# Patient Record
Sex: Male | Born: 1935 | Race: White | Hispanic: No | Smoking: Former smoker
Health system: Southern US, Community
[De-identification: ages and names within clinical notes are randomized; demographics above are authoritative.]

## PROBLEM LIST (undated history)

## (undated) DIAGNOSIS — I10 Essential (primary) hypertension: Secondary | ICD-10-CM

## (undated) DIAGNOSIS — I723 Aneurysm of iliac artery: Secondary | ICD-10-CM

## (undated) DIAGNOSIS — I442 Atrioventricular block, complete: Secondary | ICD-10-CM

## (undated) DIAGNOSIS — I2510 Atherosclerotic heart disease of native coronary artery without angina pectoris: Secondary | ICD-10-CM

## (undated) DIAGNOSIS — C61 Malignant neoplasm of prostate: Secondary | ICD-10-CM

## (undated) DIAGNOSIS — I471 Supraventricular tachycardia: Secondary | ICD-10-CM

## (undated) DIAGNOSIS — K219 Gastro-esophageal reflux disease without esophagitis: Secondary | ICD-10-CM

## (undated) HISTORY — PX: CATARACT EXTRACTION: SUR2

## (undated) HISTORY — DX: Atrioventricular block, complete: I44.2

## (undated) HISTORY — DX: Gastro-esophageal reflux disease without esophagitis: K21.9

## (undated) HISTORY — DX: Supraventricular tachycardia: I47.1

## (undated) HISTORY — PX: KIDNEY STONE SURGERY: SHX686

## (undated) HISTORY — DX: Essential (primary) hypertension: I10

## (undated) HISTORY — PX: ROTATOR CUFF REPAIR: SHX139

## (undated) HISTORY — DX: Atherosclerotic heart disease of native coronary artery without angina pectoris: I25.10

## (undated) HISTORY — PX: INGUINAL HERNIA REPAIR: SUR1180

## (undated) HISTORY — DX: Aneurysm of iliac artery: I72.3

## (undated) HISTORY — DX: Malignant neoplasm of prostate: C61

---

## 1999-09-01 ENCOUNTER — Ambulatory Visit (HOSPITAL_COMMUNITY): Admission: RE | Admit: 1999-09-01 | Discharge: 1999-09-01 | Payer: Self-pay | Admitting: *Deleted

## 1999-09-01 ENCOUNTER — Encounter: Payer: Self-pay | Admitting: *Deleted

## 1999-12-16 ENCOUNTER — Encounter (INDEPENDENT_AMBULATORY_CARE_PROVIDER_SITE_OTHER): Payer: Self-pay | Admitting: Specialist

## 1999-12-16 ENCOUNTER — Ambulatory Visit (HOSPITAL_COMMUNITY): Admission: RE | Admit: 1999-12-16 | Discharge: 1999-12-16 | Payer: Self-pay | Admitting: Gastroenterology

## 2003-05-16 ENCOUNTER — Emergency Department (HOSPITAL_COMMUNITY): Admission: EM | Admit: 2003-05-16 | Discharge: 2003-05-16 | Payer: Self-pay | Admitting: Emergency Medicine

## 2003-05-29 ENCOUNTER — Emergency Department (HOSPITAL_COMMUNITY): Admission: EM | Admit: 2003-05-29 | Discharge: 2003-05-29 | Payer: Self-pay | Admitting: Emergency Medicine

## 2005-02-22 HISTORY — PX: CARDIAC CATHETERIZATION: SHX172

## 2005-03-02 ENCOUNTER — Encounter (INDEPENDENT_AMBULATORY_CARE_PROVIDER_SITE_OTHER): Payer: Self-pay | Admitting: *Deleted

## 2005-03-02 ENCOUNTER — Ambulatory Visit (HOSPITAL_COMMUNITY): Admission: RE | Admit: 2005-03-02 | Discharge: 2005-03-02 | Payer: Self-pay | Admitting: Gastroenterology

## 2005-12-07 ENCOUNTER — Inpatient Hospital Stay (HOSPITAL_BASED_OUTPATIENT_CLINIC_OR_DEPARTMENT_OTHER): Admission: RE | Admit: 2005-12-07 | Discharge: 2005-12-07 | Payer: Self-pay | Admitting: Interventional Cardiology

## 2005-12-09 ENCOUNTER — Observation Stay (HOSPITAL_COMMUNITY): Admission: RE | Admit: 2005-12-09 | Discharge: 2005-12-10 | Payer: Self-pay | Admitting: Interventional Cardiology

## 2006-01-27 ENCOUNTER — Encounter (HOSPITAL_COMMUNITY): Admission: RE | Admit: 2006-01-27 | Discharge: 2006-04-27 | Payer: Self-pay | Admitting: Interventional Cardiology

## 2006-03-02 ENCOUNTER — Encounter: Admission: RE | Admit: 2006-03-02 | Discharge: 2006-03-02 | Payer: Self-pay | Admitting: Interventional Cardiology

## 2006-04-28 ENCOUNTER — Encounter (HOSPITAL_COMMUNITY): Admission: RE | Admit: 2006-04-28 | Discharge: 2006-05-20 | Payer: Self-pay | Admitting: Interventional Cardiology

## 2007-03-07 ENCOUNTER — Encounter: Admission: RE | Admit: 2007-03-07 | Discharge: 2007-03-07 | Payer: Self-pay | Admitting: Interventional Cardiology

## 2008-08-01 ENCOUNTER — Encounter: Admission: RE | Admit: 2008-08-01 | Discharge: 2008-08-01 | Payer: Self-pay | Admitting: Interventional Cardiology

## 2010-01-19 ENCOUNTER — Inpatient Hospital Stay (HOSPITAL_COMMUNITY): Admission: EM | Admit: 2010-01-19 | Discharge: 2010-01-21 | Payer: Self-pay | Admitting: Emergency Medicine

## 2010-01-19 ENCOUNTER — Ambulatory Visit: Payer: Self-pay | Admitting: Internal Medicine

## 2010-01-20 ENCOUNTER — Encounter: Payer: Self-pay | Admitting: Internal Medicine

## 2010-01-20 HISTORY — PX: INSERT / REPLACE / REMOVE PACEMAKER: SUR710

## 2010-01-28 ENCOUNTER — Encounter: Payer: Self-pay | Admitting: Internal Medicine

## 2010-01-29 ENCOUNTER — Ambulatory Visit: Payer: Self-pay

## 2010-01-29 ENCOUNTER — Encounter: Payer: Self-pay | Admitting: Internal Medicine

## 2010-03-24 NOTE — Procedures (Signed)
Summary: wound check.mdt.amber   Current Medications (verified): 1)  Hydrochlorothiazide 25 Mg Tabs (Hydrochlorothiazide) .... Take 1/2 Tablet By Mouth Once Daily 2)  Omeprazole 20 Mg Cpdr (Omeprazole) .... Take 1 Capsule By Mouth Once Daily 3)  Losartan Potassium 100 Mg Tabs (Losartan Potassium) .... Take 1 Tablet By Mouth Once Daily 4)  Plavix 75 Mg Tabs (Clopidogrel Bisulfate) .... Take 1 Tablet By Mouth Once Daily 5)  Vitamin E 1000 Unit Caps (Vitamin E) .... Take 1 Capsule By Mouth Once Daily 6)  Pravastatin Sodium 20 Mg Tabs (Pravastatin Sodium) .... Take 1 Tablet By Mouth Once Daily 7)  Metoprolol Tartrate 25 Mg Tabs (Metoprolol Tartrate) .... Take 1 Tablet By Mouth Once Daily 8)  Aspirin 325 Mg Tabs (Aspirin) .... Take 1 Tablet By Mouth Once Daily 9)  Loratadine 10 Mg Tabs (Loratadine) .... Take 1 Tablet By Mouth Once Daily 10)  M2 Zinc-50 50 Mg Tabs (Zinc) .... Take 1 Tablet By Mouth Once Daily 11)  Calcium Carbonate 600 Mg Tabs (Calcium Carbonate) .... Take 1 Tablet By Mouth Once Daily 12)  B Complex  Tabs (B Complex Vitamins) .... Take 1 Tablet By Mouth Once Daily 13)  Centrum Silver  Tabs (Multiple Vitamins-Minerals) .... Take 1 Tablet By Mouth Once Daily 14)  Icaps Mv  Tabs (Multiple Vitamins-Minerals) .... Take 1 Tablet By Mouth Once Daily 15)  Vitamin C 1000 Mg Tabs (Ascorbic Acid) .... Take 1 Tablet By Mouth Once Daily  Allergies (verified): 1)  ! Pcn  PPM Specifications Following MD:  Lewayne Bunting, MD     PPM Vendor:  Medtronic     PPM Model Number:  [id number]     PPM Serial Number:  [id number] PPM DOI:  01/20/2010     PPM Implanting MD:  Lewayne Bunting, MD  Lead 1    Location: RA     DOI: 01/20/2010     Model #: [id number]     Serial #: [id number]     Status: active Lead 2    Location: RV     DOI: 01/20/2010     Model #: [id number]     Serial #: [id number]     Status: active  Magnet Response Rate:  BOL 85 ERI 65  Indications:  CHB   PPM Follow Up Battery Voltage:  2.79 V      Battery Est. Longevity:  10 yrs       PPM Device Measurements Atrium  Amplitude: 5.60 mV, Impedance: 489 ohms, Threshold: 0.625 V at 0.40 msec Right Ventricle  Amplitude: PACED mV, Impedance: 1277 ohms, Threshold: 0.50 V at 0.40 msec  Episodes MS Episodes:  1     Percent Mode Switch:  <0.1%     Ventricular High Rate:  0     Atrial Pacing:  27.7%     Ventricular Pacing:  96.8%  Parameters Mode:  DDDR     Lower Rate Limit:  60     Upper Rate Limit:  120 Paced AV Delay:  150     Sensed AV Delay:  120 Next Cardiology Appt Due:  05/05/2010 Tech Comments:  WOUND CHECK---STERI STRIPS REMOVED.  MINIMAL SWELLING AND BRUISING AT SITE.  1 MODE SWITCH LESS THAN 1 MINUTE.  NORMAL DEVICE FUNCTION.  NO CHANGES MADE. ROV 05-05-10 @ [address]/GT. Vella Kohler  January 29, 2010 4:22 PM   Patient Instructions: 1)  OFFICE VISIT WITH DR Ladona Ridgel 05-05-10 @ [phone]

## 2010-03-24 NOTE — Miscellaneous (Signed)
Summary: Device preload  Clinical Lists Changes  Observations: Added new observation of PPM INDICATN: CHB (01/28/2010 13:37) Added new observation of MAGNET RTE: BOL 85 ERI 65 (01/28/2010 13:37) Added new observation of PPMLEADSTAT2: active (01/28/2010 13:37) Added new observation of PPMLEADSER2: [id number] (01/28/2010 13:37) Added new observation of PPMLEADMOD2: 5076  (01/28/2010 13:37) Added new observation of PPMLEADDOI2: 01/20/2010  (01/28/2010 13:37) Added new observation of PPMLEADLOC2: RV  (01/28/2010 13:37) Added new observation of PPMLEADSTAT1: active  (01/28/2010 13:37) Added new observation of PPMLEADSER1: [id number]  (01/28/2010 13:37) Added new observation of PPMLEADMOD1: 5076  (01/28/2010 13:37) Added new observation of PPMLEADDOI1: 01/20/2010  (01/28/2010 13:37) Added new observation of PPMLEADLOC1: RA  (01/28/2010 13:37) Added new observation of PPM IMP MD: Lewayne Bunting, MD  (01/28/2010 13:37) Added new observation of PPM DOI: 01/20/2010  (01/28/2010 13:37) Added new observation of PPM SERL#: [id number]  (01/28/2010 13:37) Added new observation of PPM MODL#: [id number]  (01/28/2010 37:16) Added new observation of PACEMAKERMFG: Medtronic  (01/28/2010 13:37) Added new observation of PACEMAKER MD: Lewayne Bunting, MD  (01/28/2010 13:37)      PPM Specifications Following MD:  Lewayne Bunting, MD     PPM Vendor:  Medtronic     PPM Model Number:  [id number]     PPM Serial Number:  [id number] PPM DOI:  01/20/2010     PPM Implanting MD:  Lewayne Bunting, MD  Lead 1    Location: RA     DOI: 01/20/2010     Model #: [id number]     Serial #: [id number]     Status: active Lead 2    Location: RV     DOI: 01/20/2010     Model #: [id number]     Serial #: [id number]     Status: active  Magnet Response Rate:  BOL 85 ERI 65  Indications:  CHB

## 2010-03-26 NOTE — Cardiovascular Report (Signed)
Summary: Confidential Patient Implant Record Information   Confidential Patient Implant Record Information   Imported By: Roderic Ovens 02/05/2010 12:18:33  _____________________________________________________________________  External Attachment:    Type:   Image     Comment:   External Document

## 2010-03-26 NOTE — Cardiovascular Report (Signed)
Summary: Office Visit   Office Visit   Imported By: Roderic Ovens 02/05/2010 12:17:30  _____________________________________________________________________  External Attachment:    Type:   Image     Comment:   External Document

## 2010-04-27 ENCOUNTER — Other Ambulatory Visit: Payer: Self-pay | Admitting: Interventional Cardiology

## 2010-04-27 DIAGNOSIS — I723 Aneurysm of iliac artery: Secondary | ICD-10-CM

## 2010-04-29 ENCOUNTER — Ambulatory Visit
Admission: RE | Admit: 2010-04-29 | Discharge: 2010-04-29 | Disposition: A | Discharge status: Home / Self Care | Payer: Medicare Other | Source: Ambulatory Visit | Attending: Interventional Cardiology | Admitting: Interventional Cardiology

## 2010-04-29 DIAGNOSIS — I723 Aneurysm of iliac artery: Secondary | ICD-10-CM

## 2010-04-29 MED ORDER — IOHEXOL 350 MG/ML SOLN
100.0000 mL | Freq: Once | INTRAVENOUS | Status: AC | PRN
  Administered 2010-04-29: 100 mL via INTRAVENOUS

## 2010-05-05 ENCOUNTER — Encounter (INDEPENDENT_AMBULATORY_CARE_PROVIDER_SITE_OTHER): Payer: MEDICARE | Admitting: Internal Medicine

## 2010-05-05 ENCOUNTER — Encounter: Payer: Self-pay | Admitting: Internal Medicine

## 2010-05-05 DIAGNOSIS — I10 Essential (primary) hypertension: Secondary | ICD-10-CM

## 2010-05-05 DIAGNOSIS — I5032 Chronic diastolic (congestive) heart failure: Secondary | ICD-10-CM | POA: Insufficient documentation

## 2010-05-05 DIAGNOSIS — I495 Sick sinus syndrome: Secondary | ICD-10-CM

## 2010-05-05 DIAGNOSIS — I442 Atrioventricular block, complete: Secondary | ICD-10-CM | POA: Insufficient documentation

## 2010-05-05 DIAGNOSIS — Z950 Presence of cardiac pacemaker: Secondary | ICD-10-CM | POA: Insufficient documentation

## 2010-05-05 LAB — DIFFERENTIAL
Basophils Absolute: 0 10*3/uL (ref 0.0–0.1)
Eosinophils Relative: 3 % (ref 0–5)
Lymphocytes Relative: 37 % (ref 12–46)
Lymphs Abs: 2.3 10*3/uL (ref 0.7–4.0)
Monocytes Absolute: 0.6 10*3/uL (ref 0.1–1.0)
Neutro Abs: 3 10*3/uL (ref 1.7–7.7)

## 2010-05-05 LAB — CBC
MCV: 93.9 fL (ref 78.0–100.0)
Platelets: 206 10*3/uL (ref 150–400)
RBC: 4.12 MIL/uL — ABNORMAL LOW (ref 4.22–5.81)
RDW: 13.4 % (ref 11.5–15.5)
WBC: 6.1 10*3/uL (ref 4.0–10.5)

## 2010-05-05 LAB — COMPREHENSIVE METABOLIC PANEL
AST: 30 U/L (ref 0–37)
Albumin: 3.5 g/dL (ref 3.5–5.2)
BUN: 24 mg/dL — ABNORMAL HIGH (ref 6–23)
Calcium: 8.7 mg/dL (ref 8.4–10.5)
Chloride: 106 mEq/L (ref 96–112)
Creatinine, Ser: 1.16 mg/dL (ref 0.4–1.5)
GFR calc Af Amer: >60 mL/min (ref >60)
Total Bilirubin: 0.7 mg/dL (ref 0.3–1.2)
Total Protein: 6.6 g/dL (ref 6.0–8.3)

## 2010-05-05 LAB — CARDIAC PANEL(CRET KIN+CKTOT+MB+TROPI)
CK, MB: 1.8 ng/mL (ref 0.3–4.0)
Relative Index: INVALID (ref 0.0–2.5)
Total CK: 75 U/L (ref 7–232)
Troponin I: 0.03 ng/mL (ref 0.00–0.06)

## 2010-05-05 LAB — BRAIN NATRIURETIC PEPTIDE: Pro B Natriuretic peptide (BNP): 36 pg/mL (ref 0.0–100.0)

## 2010-05-05 LAB — CK TOTAL AND CKMB (NOT AT ARMC)
CK, MB: 3.2 ng/mL (ref 0.3–4.0)
Relative Index: 2.9 — ABNORMAL HIGH (ref 0.0–2.5)
Total CK: 112 U/L (ref 7–232)

## 2010-05-05 LAB — MRSA PCR SCREENING: MRSA by PCR: NEGATIVE

## 2010-05-05 LAB — PROTIME-INR: INR: 1.3 (ref 0.00–1.49)

## 2010-05-05 LAB — TSH: TSH: 2.164 u[IU]/mL (ref 0.350–4.500)

## 2010-05-12 ENCOUNTER — Encounter (INDEPENDENT_AMBULATORY_CARE_PROVIDER_SITE_OTHER): Payer: MEDICARE | Admitting: Vascular Surgery

## 2010-05-12 DIAGNOSIS — I723 Aneurysm of iliac artery: Secondary | ICD-10-CM

## 2010-05-12 NOTE — Assessment & Plan Note (Signed)
Summary: pacer check.mdt.amber/cy   Visit Type:  Follow-up   History of Present Illness: [name] returns today for PPM followup. He is a pleasant 77 yo man with CHB, CHF, and HTN. He is s/p PPM. He has had some dyspnea and has had his medications adjusted by Dr. Eldridge Dace. He denies c/p or peripheral edema. No other complaints today.  Medications Prior to Update: 1)  Hydrochlorothiazide 25 Mg Tabs (Hydrochlorothiazide) .... Take 1/2 Tablet By Mouth Once Daily 2)  Omeprazole 20 Mg Cpdr (Omeprazole) .... Take 1 Capsule By Mouth Once Daily 3)  Losartan Potassium 100 Mg Tabs (Losartan Potassium) .... Take 1 Tablet By Mouth Once Daily 4)  Plavix 75 Mg Tabs (Clopidogrel Bisulfate) .... Take 1 Tablet By Mouth Once Daily 5)  Vitamin E 1000 Unit Caps (Vitamin E) .... Take 1 Capsule By Mouth Once Daily 6)  Pravastatin Sodium 20 Mg Tabs (Pravastatin Sodium) .... Take 1 Tablet By Mouth Once Daily 7)  Metoprolol Tartrate 25 Mg Tabs (Metoprolol Tartrate) .... Take 1 Tablet By Mouth Once Daily 8)  Aspirin 325 Mg Tabs (Aspirin) .... Take 1 Tablet By Mouth Once Daily 9)  Loratadine 10 Mg Tabs (Loratadine) .... Take 1 Tablet By Mouth Once Daily 10)  M2 Zinc-50 50 Mg Tabs (Zinc) .... Take 1 Tablet By Mouth Once Daily 11)  Calcium Carbonate 600 Mg Tabs (Calcium Carbonate) .... Take 1 Tablet By Mouth Once Daily 12)  B Complex  Tabs (B Complex Vitamins) .... Take 1 Tablet By Mouth Once Daily 13)  Centrum Silver  Tabs (Multiple Vitamins-Minerals) .... Take 1 Tablet By Mouth Once Daily 14)  Icaps Mv  Tabs (Multiple Vitamins-Minerals) .... Take 1 Tablet By Mouth Once Daily 15)  Vitamin C 1000 Mg Tabs (Ascorbic Acid) .... Take 1 Tablet By Mouth Once Daily  Allergies (verified): 1)  ! Pcn  Past History:  Past Medical History: Last updated: 05/04/2010   1. Coronary artery disease.   2. Right iliac aneurysm.   3. Hypertension.   4. GERD.   5. Prostate cancer.   6. History of SVT.   7. Hearing loss.  8.  successful implantation of a Medtronic dual-   chamber pacemaker in a patient with symptomatic complete heart block.                Past Surgical History: Last updated: 05/04/2010 Dual-chamber permanent pacemaker implantation by   Dr. Lewayne Bunting on January 20, 2010.     Right rotator cuff surgery, right inguinal hernia   surgery, left-sided kidney stone, right eye cataract.   Review of Systems  The patient denies chest pain, syncope, dyspnea on exertion, and peripheral edema.    Vital Signs:  Patient profile:   77 year old male Height:      70 inches Weight:      150 pounds BMI:     21.60 Pulse rate:   58 / minute  Vitals Entered By: Laurance Flatten CMA (May 05, 2010 10:24 AM)  Physical Exam  General:  elderly, well developed, well nourished man, in no acute distress.  HEENT: normal Neck: supple. No JVD. Carotids 2+ bilaterally no bruits Cor: RRR no rubs, gallops or murmur Lungs: CTA with well healed PPM incision. Ab: soft, nontender. nondistended. No HSM. Good bowel sounds Ext: warm. no cyanosis, clubbing or edema Neuro: alert and oriented. Grossly nonfocal. affect pleasant    PPM Specifications Following MD:  Lewayne Bunting, MD     PPM Vendor:  Medtronic  PPM Model Number:  [id number]     PPM Serial Number:  [id number] PPM DOI:  01/20/2010     PPM Implanting MD:  Lewayne Bunting, MD  Lead 1    Location: RA     DOI: 01/20/2010     Model #: [id number]     Serial #: [id number]     Status: active Lead 2    Location: RV     DOI: 01/20/2010     Model #: [id number]     Serial #: [id number]     Status: active  Magnet Response Rate:  BOL 85 ERI 65  Indications:  CHB   Parameters Mode:  DDDR     Lower Rate Limit:  60     Upper Rate Limit:  120 Paced AV Delay:  150     Sensed AV Delay:  120 MD Comments:  Normal device function.  Impression & Recommendations:  Problem # 1:  CARDIAC PACEMAKER IN SITU (ICD-V45.01) His device is working normally. Will recheck in several  months.  Problem # 2:  ESSENTIAL HYPERTENSION, BENIGN (ICD-401.1) He will maintain a low sodium diet and continue meds as below. His updated medication list for this problem includes:    Hydrochlorothiazide 25 Mg Tabs (Hydrochlorothiazide) .Marland Kitchen... Take 1/2 tablet by mouth once daily    Losartan Potassium 100 Mg Tabs (Losartan potassium) .Marland Kitchen... Take 1 tablet by mouth once daily    Metoprolol Tartrate 25 Mg Tabs (Metoprolol tartrate) .Marland Kitchen... Take 1 tablet by mouth once daily    Aspirin 81 Mg Tbec (Aspirin) .Marland Kitchen... Take one tablet by mouth daily  Problem # 3:  CHRONIC DIASTOLIC HEART FAILURE (ICD-428.32) His symptoms are class 2. He will continue his current meds and maintain a low sodium diet. His updated medication list for this problem includes:    Hydrochlorothiazide 25 Mg Tabs (Hydrochlorothiazide) .Marland Kitchen... Take 1/2 tablet by mouth once daily    Losartan Potassium 100 Mg Tabs (Losartan potassium) .Marland Kitchen... Take 1 tablet by mouth once daily    Plavix 75 Mg Tabs (Clopidogrel bisulfate) .Marland Kitchen... Take 1 tablet by mouth once daily    Metoprolol Tartrate 25 Mg Tabs (Metoprolol tartrate) .Marland Kitchen... Take 1 tablet by mouth once daily    Aspirin 81 Mg Tbec (Aspirin) .Marland Kitchen... Take one tablet by mouth daily  Patient Instructions: 1)  Your physician wants you to follow-up in:  9 months with Dr Court Joy will receive a reminder letter in the mail two months in advance. If you don't receive a letter, please call our office to schedule the follow-up appointment. 2)  Your physician recommends that you continue on your current medications as directed. Please refer to the Current Medication list given to you today.

## 2010-05-12 NOTE — Cardiovascular Report (Signed)
Summary: Office Visit   Office Visit   Imported By: Roderic Ovens 05/06/2010 11:52:36  _____________________________________________________________________  External Attachment:    Type:   Image     Comment:   External Document

## 2010-05-13 NOTE — Consult Note (Signed)
NEW PATIENT CONSULTATION  [name], [name] DOB:  07/06/1920                                       05/12/2010 WJXBJ#:[id number]  Patient is an 77 year old healthy male referred by Dr. Eldridge [name] for right common iliac artery aneurysm, which has been followed for several years.  He had a CT angiogram performed on 04/29/2010 which revealed the right common iliac aneurysm to be 2.2 cm in maximum diameter, slightly enlarged from the previously measured at 2.0 cm.  He has no evidence of abdominal aortic aneurysm or left common iliac artery aneurysm.  He is also asymptomatic.  CHRONIC MEDICAL PROBLEMS: 1. Coronary artery disease, previous PTCA and stenting. 2. Hypertension. 3. Mitral valve disorder. 4. GERD. 5. History of prostate cancer. 6. History of colonic polyps. 7. Complete heart block with pacemaker. 8. Negative for diabetes or stroke.  SOCIAL HISTORY:  He is married, has 1 child, is retired.  Does not use tobacco or alcohol.  FAMILY HISTORY:  Positive for a small abdominal aortic aneurysm in his father.  A cerebral hemorrhage in his father.  Negative for coronary artery disease or diabetes.  REVIEW OF SYSTEMS:  Positive for leg discomfort periodically.  He also has a chest tightness, dyspnea on exertion.  No orthopnea.  No chronic bronchitis or wheezing.  Does have occasional reflux esophagitis.  All other systems are negative in a complete review of systems.  PHYSICAL EXAMINATION:  Blood pressure 142/76, heart rate 77, respirations 19.  General:  He is a well-developed and well-nourished male in no apparent distress, alert and oriented x3.  HEENT:  Normal for age.  EOMs intact.  Lungs:  Clear to auscultation.  No rhonchi or wheezing.  Cardiovascular:  Regular rhythm.  His pacemaker is palpable beneath the skin in the left infraclavicular area.  Carotid pulses are 3+.  No audible bruits.  Abdomen is soft, nontender, no masses are noted.  There is no  tenderness.  Musculoskeletal exam is free of major deformities.  Neurologic:  Normal.  Skin is free of rashes.  Lower extremity exam reveals 3+ femoral and popliteal, and 2+ posterior tibial pulses palpable bilaterally.  Today I reviewed the records provided by Dr. Eldridge [name] regarding patient's cardiac status.  I also reviewed his CT angiogram which was performed on March 7.  Discussed these in detail with patient.  He has a small right common iliac artery aneurysm which does not require treatment, and it is unlikely that it will require treatment.  We will follow this with a repeat CT scan in 2 years to see if there has been any significant change.  At his age of 61 and very slow growth of this, it is unlikely that he will ever require treatment for this small, asymptomatic aneurysm.    [name] [name] [name] [name], M.D. Electronically Signed  [name] [name]  D:  05/12/2010  T:  05/13/2010  Job:  [id number]  cc:   Dr. Precious [name] [name] Crafts, MD

## 2010-07-10 NOTE — Cardiovascular Report (Signed)
NAME:  [name] [name], [name] [name]                ACCOUNT NO.:  [id number]   MEDICAL RECORD NO.:  [id number]          PATIENT TYPE:  INP   LOCATION:  6531                         FACILITY:  [facility]   PHYSICIAN:  Corky Crafts, MDDATE OF BIRTH:  [birth year]   DATE OF PROCEDURE:  12/09/2005  DATE OF DISCHARGE:                              CARDIAC CATHETERIZATION   REFERRING PHYSICIAN:  Dr. Kirby Funk   PROCEDURES PERFORMED:  PCI of the LAD, selective renal angiogram.   OPERATOR:  Dr. Eldridge Dace   INDICATIONS:  Congestive heart failure and hypertension.   PROCEDURAL NARRATIVE:  The diagnostic procedure was done 2 days before  showing a significant LAD lesion.  The risks and benefits of PCI were  explained to the patient and informed consent was obtained.  A CLS 4.0  guiding catheter was advanced in the ostium of the left main under  fluoroscopic guidance.  Intracoronary nitroglycerin was given which did  increase the size of the artery and make the stenosis more severe.  A  Prowater wire was placed down the LAD.  A 2.0 x 9-mm Maverick balloon was  inflated to 8 atmospheres for 18 seconds.  A 2.5 x 12-mm Taxus stent was  then deployed across the lesion at 12 atmospheres for 35 seconds.  There was  a small segment distal to the stent which had a residual stenosis.  It  appeared about 2 mm.  A 2.5 x 9-mm Maverick balloon was then inflated at the  distal edge of the stent and inflated to 6 atmospheres for 37 seconds.  There is an excellent angiographic result.  Flow remained TIMI 3.  We  decided not to stent this very short segment to avoid a long area of  overlap.  The selective renal angiogram was then performed.  There is a  single right renal artery which is widely patent.  There is a single left  renal artery with a 25% ostial lesion.  There is no pressure gradient.   IMPRESSIONS:  1. Successful percutaneous coronary intervention of the left anterior      descending artery with a 2.5 x 12-mm  Taxus stent.  The stent was placed      in the mid LAD.  2. No hemodynamically significant renal artery stenosis.   RECOMMENDATIONS:  The patient will be observed overnight.  We will continue  aspirin 325 mg daily and Plavix 75 mg daily indefinitely.  Will also  continue Cozaar and Toprol given his systolic dysfunction.      Corky Crafts, MD  Electronically Signed     JSV/MEDQ  D:  12/09/2005  T:  12/10/2005  Job:  [id number]

## 2010-07-10 NOTE — Procedures (Signed)
[facility]  Patient:    [name] [name], [name] [name]                         MRN: [id number] Proc. Date: 12/16/99 Attending:  Verlin Grills, M.D. CC:         Thora Lance, M.D., [facility]   Procedure Report  PROCEDURE:  Colonoscopy and rectal polypectomy.  PROCEDURE INDICATION:  [name] is a 77 year old male who is due for colorectal neoplasia screening.  He viewed our colonoscopy education film.  I discussed with him the complications associated with colonoscopy and polypectomy, including intestinal bleeding and intestinal perforation.  [name] [name] has signed the operative permit.  MEDICATION ALLERGIES:  PENICILLIN.  PAST MEDICAL HISTORY:  Prostate cancer treated with radiation therapy. Radiation proctitis.  Gastroesophageal reflux disease.  Right rotator cuff surgery.  Right inguinal herniorrhaphy.  Kidney stones requiring lithotripsy. Right cataract surgery.  CHRONIC MEDICATIONS:  Prilosec and multivitamin.  ENDOSCOPIST:  Verlin Grills, M.D.  PREMEDICATION:  Versed 5 mg, Demerol 35 mg.  ENDOSCOPE:  Olympus pediatric colonoscope.  DESCRIPTION OF PROCEDURE:  After obtaining informed consent, the patient was placed in the left lateral decubitus position.  I administered intravenous Demerol and intravenous Versed to achieve conscious sedation for the procedure.  The patients blood pressure, oxygen saturation, and cardiac rhythm were monitored throughout the procedure and documented in the medical record.  Anal inspection was normal.  Digital rectal exam revealed an absent prostate. The Olympus pediatric video colonoscope was introduced into the rectum and under direct vision advanced to the cecum, identified by a normal-appearing ileocecal valve.  Colonic preparation for the exam today was excellent.  Rectum:  From the mid-rectum, three 1 mm sessile polyps were removed with cold biopsy forceps and submitted  for pathologic interpretation.  Sigmoid colon and descending colon:  A few small diverticula of the left colon were noted.  Splenic flexure normal.  Transverse colon normal.  Hepatic flexure normal.  Ascending colon normal.  Cecum and ileocecal valve normal.  ASSESSMENT: 1. A few small diverticula are noted in the left colon. 2. Three 1 mm sessile polyps were removed with the cold biopsy forceps from    the mid-rectum.  RECOMMENDATIONS:  If rectal polyps return neoplastic, [name] [name] should undergo a repeat colonoscopy in approximately five years.  If the rectal polyps are non-neoplastic, he should undergo a repeat colonoscopy in five to 10 years. DD:  12/16/99 TD:  12/16/99 Job: [id number]

## 2010-07-10 NOTE — Discharge Summary (Signed)
NAME:  [name] [name], [name] [name]                ACCOUNT NO.:  [id number]   MEDICAL RECORD NO.:  [id number]          PATIENT TYPE:  OBV   LOCATION:  6531                         FACILITY:  [facility]   PHYSICIAN:  Corky Crafts, MDDATE OF BIRTH:  20-Nov-1920   DATE OF ADMISSION:  12/09/2005  DATE OF DISCHARGE:  12/10/2005                               DISCHARGE SUMMARY   DISCHARGE DIAGNOSES:  1. Coronary artery disease status post drug-eluting stent to the left      anterior descending artery.  2. Mitral regurgitation.  3. Left ventricular dysfunction.  4. History of palpitation.  5. Long-term medication use.   HOSPITAL COURSE:  Mr. Holcomb has been aggressively treated for his LV  dysfunction with lisinopril.  In addition, he has been aggressively  treated with beta-blockers for palpitations.  Although the beta-blockers  have helped, he has continued to have palpitations and, in addition, he  has having smothering sensations.  He was ultimately scheduled for  cardiac catheterization and was found to have a significant LAD  stenosis.  Arrangements were made for the patient to come to the  hospital for percutaneous intervention on December 09, 2005.   On that date, the patient underwent percutaneous intervention utilizing  a drug-eluting stent, on December 09, 2005.  He tolerated the procedure  well.  No renal artery stenosis noted on angiogram.  The patient was  kept in the hospital overnight and lab studies on discharge showed a  potassium of 4.3, BUN 12, creatinine 0.58, hemoglobin 13, hematocrit  38.1, and platelets 193,000.  The patient was discharged to home in  stable and improved condition.   DISCHARGE INSTRUCTIONS:  Remain on a low-fat.  No driving for 2 days.  No lifting over 10 pounds for 1 week.  Clean cath site with soap and  water.  Follow up with Dr. Eldridge Dace on December 24, 2005 at 11:30 a.m.   DISCHARGE MEDICATIONS:  1. Plavix 75 mg a day.  2. Coated aspirin 325 mg a day.  3.  Pravastatin 20 mg daily.  4. Toprol XL 25 mg a day.  5. Cozaar 100 mg a day.  6. He can continue all vitamins.  We have stopped the lisinopril and      utilized Cozaar in its place.     Guy Franco, P.A.      Corky Crafts, MD  Electronically Signed   LB/MEDQ  D:  01/12/2006  T:  01/12/2006  Job:  [id number]

## 2010-07-10 NOTE — Cardiovascular Report (Signed)
NAME:  Madonia, Sh[place]ale                ACCOUNT NO.:  [id number]   MEDICAL RECORD NO.:  [id number]          PATIENT TYPE:  OIB   LOCATION:  1963                         FACILITY:  [facility]   PHYSICIAN:  Corky Crafts, MDDATE OF BIRTH:  04/03/1920   DATE OF PROCEDURE:  12/07/2005  DATE OF DISCHARGE:                              CARDIAC CATHETERIZATION   PROCEDURES PERFORMED:  1. Left heart catheterization.  2. Left ventriculogram.  3. Coronary angiogram.  4. Abdominal aortogram.   OPERATOR:  Corky Crafts, MD   INDICATIONS:  Decreased systolic left ventricular function.   PROCEDURE NARRATIVE:  The risks and benefits of cardiac catheterization were  explained to the patient and informed consent was obtained.  The patient was  brought to the cath lab and placed on the table.  He was prepped and draped  in the usual sterile fashion.  Then 1% lidocaine was infiltrated into his  right groin.  A 4-French arterial sheath was placed into his right femoral  artery using the modified Seldinger technique.  A left coronary artery  angiography was performed using a JL-4.0 catheter.  This catheter was not  long enough to reach the ostium on the left main.  The catheter was then  switched out for a JL-5.0 catheter.  Digital angiography was performed in  multiple projections using hand injection of contrast after the catheter was  placed in the vessel ostium under fluoroscopic guidance.   A right coronary artery angiography was performed using a no torque right  coronary catheter.  The catheter was advanced to the vessel ostium under  fluoroscopic guidance.  Digital angiography was performed in multiple  projections using hand injection with contrast.  The left ventriculogram was  then performed.  A pigtail catheter was advanced to the aortic valve and  across the valve under fluoroscopic guidance.  A power injection of contrast  was done in the 30-degree RAO position.  The catheter was  pulled back under  continuous hemodynamic pressure monitoring.  The catheter was then withdrawn  level of the abdominal aorta.  A power injection of contrast was done in the  AP projection.  The sheath was removed using manual compression.   FINDINGS:  1. The left main and mild irregularities, but was widely patent.  2. The circumflex was a large vessel.  There is an ostium 25% lesion.  3. There was a small OM-1.  The OM-2 with was large and had minor      irregularities.  4. The majority of the circ had only minor irregularities.  5. The left anterior descending was a large vessel.  There is a 70-80% mid      vessel lesion after the first diagonal.  The remainder of the LAD      appeared small.  The first diagonal had an ostium 40-50% lesion; and      the second diagonal was small; the third diagonal was medium-sized.  It      was a long vessel with minor irregularities.  6. The right coronary artery is a large dominant vessel with minor  irregularities.   LEFT VENTRICULOGRAM:  The left ventriculogram reveals severe left  ventricular dysfunction with an estimated ejection fraction of 30-35%.  The  abdominal aortogram revealed single arterial supply to both kidneys.  The  right renal artery is widely patent.  The ostium in the left renal artery  had a 25% lesion.  Of note, there was also a right common iliac artery  aneurysm.   HEMODYNAMICS:  Left ventricular pressure 135/8, LVEDP of 16 mmHg.  Aortic  pressure 138/62 with a mean aortic pressure of 96 mmHg.   IMPRESSION:  1. Severe left ventricular systolic function with an estimated ejection      fraction of 30-35%.  2. A 70-80% mid-LAD lesion with other mild-to-moderate coronary artery      disease.  3. Mildly increased left ventricular end-diastolic pressure   RECOMMENDATIONS:  The patient will continue on his aggressive medical  therapy including an ARB, beta blocker; and we will add Plavix, as well, and  consider PCI of  the LAD.      Corky Crafts, MD  Electronically Signed     JSV/MEDQ  D:  12/07/2005  T:  12/08/2005  Job:  [id number]

## 2010-07-10 NOTE — Op Note (Signed)
NAME:  [name] [name], [name] [name]                ACCOUNT NO.:  [id number]   MEDICAL RECORD NO.:  [id number]          PATIENT TYPE:  AMB   LOCATION:  ENDO                         FACILITY:  [facility]   PHYSICIAN:  Danise Edge, M.D.   DATE OF BIRTH:  [birth year]   DATE OF PROCEDURE:  03/02/2005  DATE OF DISCHARGE:                                 OPERATIVE REPORT   PROCEDURE:  Colonoscopy and polypectomy.   PROCEDURE INDICATIONS:  [name] is an 77 year old male born  Jul 24, 1920. Five  years ago diminutive hyperplastic and adenomatous  polyps were removed from his rectum. [name] is scheduled to undergo a  surveillance colonoscopy with a polypectomy to prevent colon cancer.   ENDOSCOPIST:  Danise Edge, M.D.   PREMEDICATION:  Versed 2.5 mg, Demerol 50 mg.   DESCRIPTION OF PROCEDURE:  After obtaining informed consent. Mr. Schauer was  placed in the left lateral decubitus position. I administered intravenous  Demerol and intravenous Versed to achieve conscious sedation for the  procedure. The patient's blood pressure, oxygen saturation and cardiac  rhythm were monitored throughout the procedure and documented in the medical  record.   Anal inspection was normal. Digital rectal exam reveals an absent prostate.  The patient has received radiation for prostate cancer. The Olympus  adjustable pediatric colonoscope was introduced into the rectum and advanced  to the cecum. A normal-appearing ileocecal valve and appendiceal orifice  were identified. Colonic preparation for the exam today was satisfactory.   RECTUM:  Normal. Retroflex view of the distal rectum normal.  SIGMOID COLON AND DESCENDING COLON:  From the distal sigmoid colon at 20 cm  from the anal verge, a 3 mm sized polyp was removed with the cautery snare.  Extensive sigmoid colonic diverticulosis present.  SPLENIC FLEXURE:  Normal.  TRANSVERSE COLON:  Normal.  HEPATIC FLEXURE:  Normal.  ASCENDING COLON:  Normal.  CECUM  AND ILEOCECAL VALVE:  Normal.   ASSESSMENT:  A small polyp was removed from the distal sigmoid colon with  the electrocautery snare. Sigmoid colonic diverticulosis present.           ______________________________  Danise Edge, M.D.     MJ/MEDQ  D:  03/02/2005  T:  03/02/2005  Job:  [id number]   cc:   Thora Lance, M.D.  Fax: [phone]

## 2010-08-25 ENCOUNTER — Encounter: Payer: Self-pay | Admitting: Internal Medicine

## 2011-01-29 ENCOUNTER — Ambulatory Visit (INDEPENDENT_AMBULATORY_CARE_PROVIDER_SITE_OTHER): Payer: Self-pay | Admitting: Internal Medicine

## 2011-01-29 DIAGNOSIS — Z950 Presence of cardiac pacemaker: Secondary | ICD-10-CM

## 2011-01-29 DIAGNOSIS — I509 Heart failure, unspecified: Secondary | ICD-10-CM

## 2011-01-29 DIAGNOSIS — I5032 Chronic diastolic (congestive) heart failure: Secondary | ICD-10-CM

## 2011-01-29 DIAGNOSIS — I10 Essential (primary) hypertension: Secondary | ICD-10-CM

## 2011-01-31 ENCOUNTER — Encounter: Payer: Self-pay | Admitting: Internal Medicine

## 2011-01-31 NOTE — Assessment & Plan Note (Signed)
His device is working satisfactorily. Will plan to recheck in several months.

## 2011-01-31 NOTE — Progress Notes (Signed)
HPI [name] [name] returns today for followup. He is a pleasant 77 yo man with a h/o symptomatic bradycardia, s/p PPM, HTN, CAD, and dyslipidemia. He denies syncope since PPM implant. No chest pain or worsening dyspnea. Allergies  Allergen Reactions  . Penicillins      Current Outpatient Prescriptions  Medication Sig Dispense Refill  . Ascorbic Acid (VITAMIN C) 1000 MG tablet Take 1,000 mg by mouth daily.        Marland Kitchen aspirin 81 MG EC tablet Take 81 mg by mouth daily.        Marland Kitchen b complex vitamins tablet Take 1 tablet by mouth daily.        . calcium carbonate (OS-CAL) 600 MG TABS Take 600 mg by mouth daily.        . clopidogrel (PLAVIX) 75 MG tablet Take 75 mg by mouth daily.        . hydrochlorothiazide 25 MG tablet Take 12.5 mg by mouth daily.        Marland Kitchen losartan (COZAAR) 100 MG tablet Take 100 mg by mouth daily.        . M2 Zinc-50 50 MG TABS Take 1 tablet by mouth daily.        . metoprolol tartrate (LOPRESSOR) 25 MG tablet Take 25 mg by mouth daily.        . Multiple Vitamins-Minerals (CENTRUM SILVER) tablet Take 1 tablet by mouth daily.        . Multiple Vitamins-Minerals (ICAPS MV) TABS Take 1 tablet by mouth daily.        Marland Kitchen omeprazole (PRILOSEC) 20 MG capsule Take 20 mg by mouth daily.        . pravastatin (PRAVACHOL) 20 MG tablet Take 20 mg by mouth daily.        . vitamin E 1000 UNIT capsule Take 1,000 Units by mouth daily.           Past Medical History  Diagnosis Date  . Coronary artery disease   . Iliac artery aneurysm, right   . Hypertension   . GERD (gastroesophageal reflux disease)   . Prostate cancer   . SVT (supraventricular tachycardia)   . Hearing loss   . Complete heart block     Symptomatic    ROS:   All systems reviewed and negative except as noted in the HPI.   Past Surgical History  Procedure Date  . Insert / replace / remove pacemaker 01/20/10    dual-chamber permanent pacemaker implantation by Dr. Lewayne Bunting  . Rotator cuff repair     Right  .  Inguinal hernia repair     Right  . Kidney stone surgery     Left-sided  . Cataract extraction     Right eye     No family history on file.   History   Social History  . Marital Status: Married    Spouse Name: N/A    Number of Children: N/A  . Years of Education: N/A   Occupational History  . Retired    Social History Main Topics  . Smoking status: Former Games developer  . Smokeless tobacco: Not on file   Comment: Quit smoking many years ago.  . Alcohol Use: No  . Drug Use:   . Sexually Active:    Other Topics Concern  . Not on file   Social History Narrative  . No narrative on file     There were no vitals taken for this visit.  Physical Exam: BP - 134/76,  P - 60, R - 16 by me Well appearing elderly man, NAD HEENT: Unremarkable Neck:  No JVD, no thyromegally Lungs:  Clear with no wheezes. HEART:  Regular rate rhythm, no murmurs, no rubs, no clicks Abd:  soft, positive bowel sounds, no organomegally, no rebound, no guarding Ext:  2 plus pulses, no edema, no cyanosis, no clubbing Skin:  No rashes no nodules Neuro:  CN II through XII intact, motor grossly intact  DEVICE  Normal device function.  See PaceArt for details.   Assess/Plan:

## 2011-01-31 NOTE — Assessment & Plan Note (Signed)
His symptoms remain class 2. He will continue a low sodium diet, and his current meds.

## 2011-01-31 NOTE — Assessment & Plan Note (Signed)
His blood pressure is well controlled. He will continue his current meds and maintain a low sodium diet. 

## 2011-03-19 ENCOUNTER — Encounter: Payer: Self-pay | Admitting: Internal Medicine

## 2011-03-19 ENCOUNTER — Ambulatory Visit (INDEPENDENT_AMBULATORY_CARE_PROVIDER_SITE_OTHER): Payer: Medicare Other | Admitting: Internal Medicine

## 2011-03-19 DIAGNOSIS — I509 Heart failure, unspecified: Secondary | ICD-10-CM

## 2011-03-19 DIAGNOSIS — I442 Atrioventricular block, complete: Secondary | ICD-10-CM

## 2011-03-19 DIAGNOSIS — Z950 Presence of cardiac pacemaker: Secondary | ICD-10-CM

## 2011-03-19 DIAGNOSIS — I5032 Chronic diastolic (congestive) heart failure: Secondary | ICD-10-CM

## 2011-03-19 DIAGNOSIS — I10 Essential (primary) hypertension: Secondary | ICD-10-CM

## 2011-03-19 LAB — PACEMAKER DEVICE OBSERVATION
AL THRESHOLD: 0.625 V
BAMS-0001: 150 {beats}/min
RV LEAD THRESHOLD: 0.5 V

## 2011-03-19 NOTE — Assessment & Plan Note (Signed)
His chf is class 1. He will continue his current meds and maintain a low sodium diet.

## 2011-03-19 NOTE — Assessment & Plan Note (Signed)
His device is working normally. Will recheck in several months. 

## 2011-03-19 NOTE — Patient Instructions (Signed)
Your physician wants you to follow-up in: 6 months in the device clinic and 12 months with Dr Taylor You will receive a reminder letter in the mail two months in advance. If you don't receive a letter, please call our office to schedule the follow-up appointment.  

## 2011-03-19 NOTE — Progress Notes (Signed)
HPI [name] [name] returns today for followup. He is a pleasant elderly man with a h/o symptomatic bradycardia and CHB, s/p PPM. He denies c/p or sob. He remains active. No syncope.  Allergies  Allergen Reactions  . Penicillins      Current Outpatient Prescriptions  Medication Sig Dispense Refill  . aspirin 81 MG EC tablet Take 81 mg by mouth daily.        Marland Kitchen b complex vitamins tablet Take 1 tablet by mouth daily.        . clopidogrel (PLAVIX) 75 MG tablet Take 75 mg by mouth daily.        . furosemide (LASIX) 40 MG tablet Take 1 tablet by mouth daily.      . hydrochlorothiazide 25 MG tablet Take 12.5 mg by mouth daily.        Marland Kitchen losartan (COZAAR) 100 MG tablet Take 100 mg by mouth daily.        . M2 Zinc-50 50 MG TABS Take 1 tablet by mouth daily.        . metoprolol tartrate (LOPRESSOR) 25 MG tablet Take 25 mg by mouth daily.        . Multiple Vitamins-Minerals (CENTRUM SILVER) tablet Take 1 tablet by mouth daily.        . Multiple Vitamins-Minerals (ICAPS MV) TABS Take 1 tablet by mouth daily.        Marland Kitchen omeprazole (PRILOSEC) 20 MG capsule Take 20 mg by mouth daily.        . pravastatin (PRAVACHOL) 20 MG tablet Take 20 mg by mouth daily.           Past Medical History  Diagnosis Date  . Coronary artery disease   . Iliac artery aneurysm, right   . Hypertension   . GERD (gastroesophageal reflux disease)   . Prostate cancer   . SVT (supraventricular tachycardia)   . Hearing loss   . Complete heart block     Symptomatic    ROS:   All systems reviewed and negative except as noted in the HPI.   Past Surgical History  Procedure Date  . Insert / replace / remove pacemaker 01/20/10    dual-chamber permanent pacemaker implantation by Dr. Lewayne Bunting  . Rotator cuff repair     Right  . Inguinal hernia repair     Right  . Kidney stone surgery     Left-sided  . Cataract extraction     Right eye  . Cardiac catheterization 2007     No family history on file.   History    Social History  . Marital Status: Married    Spouse Name: N/A    Number of Children: N/A  . Years of Education: N/A   Occupational History  . Retired    Social History Main Topics  . Smoking status: Former Games developer  . Smokeless tobacco: Not on file   Comment: Quit smoking many years ago.  . Alcohol Use: No  . Drug Use:   . Sexually Active:    Other Topics Concern  . Not on file   Social History Narrative  . No narrative on file     BP 122/64  Pulse 73  Ht 5\' 10"  (1.778 m)  Wt 67.132 kg (148 lb)  BMI 21.24 kg/m2  Physical Exam:  Well appearing NAD HEENT: Unremarkable Neck:  No JVD, no thyromegally Lymphatics:  No adenopathy Back:  No CVA tenderness Lungs:  Clear with no wheezes. Well healed PPM incision. HEART:  Regular rate rhythm, no murmurs, no rubs, no clicks Abd:  soft, positive bowel sounds, no organomegally, no rebound, no guarding Ext:  2 plus pulses, no edema, no cyanosis, no clubbing Skin:  No rashes no nodules Neuro:  CN II through XII intact, motor grossly intact  DEVICE  Normal device function.  See PaceArt for details.   Assess/Plan:

## 2011-03-19 NOTE — Assessment & Plan Note (Signed)
His blood pressure is well controlled. He will continue his current meds and maintain a low sodium diet. 

## 2011-09-08 ENCOUNTER — Encounter: Payer: Self-pay | Admitting: Internal Medicine

## 2011-09-08 ENCOUNTER — Ambulatory Visit (INDEPENDENT_AMBULATORY_CARE_PROVIDER_SITE_OTHER): Payer: Medicare Other | Admitting: *Deleted

## 2011-09-08 DIAGNOSIS — I442 Atrioventricular block, complete: Secondary | ICD-10-CM

## 2011-09-08 LAB — PACEMAKER DEVICE OBSERVATION
AL AMPLITUDE: 4 mv
AL IMPEDENCE PM: 511 Ohm
AL THRESHOLD: 0.5 V
RV LEAD AMPLITUDE: 11.2 mv
RV LEAD IMPEDENCE PM: 975 Ohm

## 2011-09-08 NOTE — Progress Notes (Signed)
PPM check 

## 2011-10-26 ENCOUNTER — Other Ambulatory Visit: Payer: Self-pay | Admitting: Interventional Cardiology

## 2011-10-26 DIAGNOSIS — I723 Aneurysm of iliac artery: Secondary | ICD-10-CM

## 2011-10-27 ENCOUNTER — Other Ambulatory Visit: Payer: Medicare Other

## 2011-10-27 ENCOUNTER — Inpatient Hospital Stay: Admission: RE | Admit: 2011-10-27 | Payer: Medicare Other | Source: Ambulatory Visit

## 2012-03-07 ENCOUNTER — Encounter: Payer: Self-pay | Admitting: Internal Medicine

## 2012-03-07 ENCOUNTER — Ambulatory Visit (INDEPENDENT_AMBULATORY_CARE_PROVIDER_SITE_OTHER): Payer: Medicare Other | Admitting: Internal Medicine

## 2012-03-07 VITALS — BP 127/74 | HR 79 | Ht 66.5 in | Wt 147.4 lb

## 2012-03-07 DIAGNOSIS — I5032 Chronic diastolic (congestive) heart failure: Secondary | ICD-10-CM

## 2012-03-07 DIAGNOSIS — I10 Essential (primary) hypertension: Secondary | ICD-10-CM

## 2012-03-07 DIAGNOSIS — Z950 Presence of cardiac pacemaker: Secondary | ICD-10-CM

## 2012-03-07 DIAGNOSIS — I442 Atrioventricular block, complete: Secondary | ICD-10-CM

## 2012-03-07 LAB — PACEMAKER DEVICE OBSERVATION
AL AMPLITUDE: 2 mv
AL THRESHOLD: 0.5 V
BAMS-0001: 150 {beats}/min
BATTERY VOLTAGE: 2.79 V
DEVICE MODEL PM: 245369
RV LEAD AMPLITUDE: 8 mv
RV LEAD THRESHOLD: 0.5 V

## 2012-03-07 NOTE — Progress Notes (Signed)
HPI [name] [name] returns today for followup. He is a very pleasant 79 year old man with symptomatic bradycardia, status post permanent pacemaker insertion, coronary artery disease, hypertension, and dyslipidemia. In the interim he has done well. He remains busy taking care of his wife who is been in very poor health. The patient denies peripheral edema, chest pain, or shortness of breath. Allergies  Allergen Reactions  . Penicillins      Current Outpatient Prescriptions  Medication Sig Dispense Refill  . aspirin 81 MG EC tablet Take 81 mg by mouth daily.        Marland Kitchen b complex vitamins tablet Take 1 tablet by mouth daily.        . clopidogrel (PLAVIX) 75 MG tablet Take 75 mg by mouth daily.        . furosemide (LASIX) 40 MG tablet Take 1 tablet by mouth daily.      . hydrochlorothiazide 25 MG tablet Take 25 mg by mouth daily.       Marland Kitchen losartan (COZAAR) 100 MG tablet Take 100 mg by mouth daily.        . M2 Zinc-50 50 MG TABS Take 1 tablet by mouth daily.        . metoprolol tartrate (LOPRESSOR) 25 MG tablet Take 25 mg by mouth daily.        . Multiple Vitamins-Minerals (CENTRUM SILVER) tablet Take 1 tablet by mouth daily.        . Multiple Vitamins-Minerals (ICAPS MV) TABS Take 1 tablet by mouth daily.        Marland Kitchen omeprazole (PRILOSEC) 20 MG capsule Take 20 mg by mouth daily.        . pravastatin (PRAVACHOL) 20 MG tablet Take 20 mg by mouth daily.           Past Medical History  Diagnosis Date  . Coronary artery disease   . Iliac artery aneurysm, right   . Hypertension   . GERD (gastroesophageal reflux disease)   . Prostate cancer   . SVT (supraventricular tachycardia)   . Hearing loss   . Complete heart block     Symptomatic    ROS:   All systems reviewed and negative except as noted in the HPI.   Past Surgical History  Procedure Date  . Insert / replace / remove pacemaker 01/20/10    dual-chamber permanent pacemaker implantation by Dr. Lewayne Bunting  . Rotator cuff repair    Right  . Inguinal hernia repair     Right  . Kidney stone surgery     Left-sided  . Cataract extraction     Right eye  . Cardiac catheterization 2007     No family history on file.   History   Social History  . Marital Status: Married    Spouse Name: N/A    Number of Children: N/A  . Years of Education: N/A   Occupational History  . Retired    Social History Main Topics  . Smoking status: Former Games developer  . Smokeless tobacco: Not on file     Comment: Quit smoking many years ago.  . Alcohol Use: No  . Drug Use:   . Sexually Active:    Other Topics Concern  . Not on file   Social History Narrative  . No narrative on file     BP 127/74  Pulse 79  Ht 5' 6.5" (1.689 m)  Wt 147 lb 6.4 oz (66.86 kg)  BMI 23.43 kg/m2  Physical Exam:  Well appearing  79 year old man, NAD HEENT: Unremarkable Neck:  No JVD, no thyromegally Lungs:  Clear with no wheezes, rales, or rhonchi. HEART:  Regular rate rhythm, no murmurs, no rubs, no clicks Abd:  soft, positive bowel sounds, no organomegally, no rebound, no guarding Ext:  2 plus pulses, no edema, no cyanosis, no clubbing Skin:  No rashes no nodules Neuro:  CN II through XII intact, motor grossly intact  DEVICE  Normal device function.  See PaceArt for details.   Assess/Plan:

## 2012-03-07 NOTE — Assessment & Plan Note (Signed)
His blood pressure is well controlled today. He will continue his current medical therapy, increase his physical activity, and maintain a low-sodium diet.

## 2012-03-07 NOTE — Assessment & Plan Note (Signed)
His chronic diastolic heart failure remains class 1-2. He'll continue his current medical therapy.

## 2012-03-07 NOTE — Patient Instructions (Signed)
Your physician wants you to follow-up in: the device clinic in 6 months with Belenda Cruise and [place]. You will receive a reminder letter in the mail two months in advance. If you don't receive a letter, please call our office to schedule the follow-up  Appointment.  Your physician wants you to follow-up in: 1 year with Dr. Ladona Ridgel.  You will receive a reminder letter in the mail two months in advance. If you don't receive a letter, please call our office to schedule the follow-up appointment.

## 2012-03-07 NOTE — Assessment & Plan Note (Signed)
His Medtronic dual-chamber pacemaker is working normally. We'll plan to recheck in several months. 

## 2012-06-15 ENCOUNTER — Other Ambulatory Visit: Payer: Self-pay | Admitting: *Deleted

## 2012-06-15 DIAGNOSIS — I723 Aneurysm of iliac artery: Secondary | ICD-10-CM

## 2012-08-07 ENCOUNTER — Encounter: Payer: Self-pay | Admitting: Vascular Surgery

## 2012-08-08 ENCOUNTER — Ambulatory Visit
Admission: RE | Admit: 2012-08-08 | Discharge: 2012-08-08 | Disposition: A | Discharge status: Home / Self Care | Payer: Medicare Other | Source: Ambulatory Visit | Attending: Vascular Surgery | Admitting: Vascular Surgery

## 2012-08-08 ENCOUNTER — Other Ambulatory Visit: Payer: Self-pay | Admitting: Vascular Surgery

## 2012-08-08 ENCOUNTER — Encounter: Payer: Self-pay | Admitting: Vascular Surgery

## 2012-08-08 ENCOUNTER — Ambulatory Visit (INDEPENDENT_AMBULATORY_CARE_PROVIDER_SITE_OTHER): Payer: Medicare Other | Admitting: Vascular Surgery

## 2012-08-08 VITALS — BP 156/79 | HR 87 | Resp 18 | Ht 70.0 in | Wt 142.0 lb

## 2012-08-08 DIAGNOSIS — I442 Atrioventricular block, complete: Secondary | ICD-10-CM

## 2012-08-08 DIAGNOSIS — I723 Aneurysm of iliac artery: Secondary | ICD-10-CM

## 2012-08-08 DIAGNOSIS — I5032 Chronic diastolic (congestive) heart failure: Secondary | ICD-10-CM

## 2012-08-08 MED ORDER — IOHEXOL 350 MG/ML SOLN
100.0000 mL | Freq: Once | INTRAVENOUS | Status: AC | PRN
  Administered 2012-08-08: 80 mL via INTRAVENOUS

## 2012-08-08 NOTE — Progress Notes (Signed)
Subjective:     Patient ID: [name] [name], male   DOB: 07/02/1920, 79 y.o.   MRN: [id number]  HPI this 79 year old male returns for followup regarding a small right common iliac artery aneurysm discovered a few years ago. He has had no change in his health. He denies claudication. He's had no abdominal pain.  Past Medical History  Diagnosis Date  . Coronary artery disease   . Iliac artery aneurysm, right   . Hypertension   . GERD (gastroesophageal reflux disease)   . Prostate cancer   . SVT (supraventricular tachycardia)   . Hearing loss   . Complete heart block     Symptomatic    History  Substance Use Topics  . Smoking status: Former Smoker    Types: Cigarettes    Quit date: 08/09/1962  . Smokeless tobacco: Never Used     Comment: Quit smoking many years ago.  . Alcohol Use: No    No family history on file.  Allergies  Allergen Reactions  . Penicillins     Current outpatient prescriptions:aspirin 81 MG EC tablet, Take 81 mg by mouth daily.  , Disp: , Rfl: ;  b complex vitamins tablet, Take 1 tablet by mouth daily.  , Disp: , Rfl: ;  clopidogrel (PLAVIX) 75 MG tablet, Take 75 mg by mouth daily.  , Disp: , Rfl: ;  furosemide (LASIX) 40 MG tablet, Take 1 tablet by mouth daily., Disp: , Rfl: ;  hydrochlorothiazide 25 MG tablet, Take 25 mg by mouth daily. , Disp: , Rfl:  losartan (COZAAR) 100 MG tablet, Take 100 mg by mouth daily.  , Disp: , Rfl: ;  M2 Zinc-50 50 MG TABS, Take 1 tablet by mouth daily.  , Disp: , Rfl: ;  metoprolol tartrate (LOPRESSOR) 25 MG tablet, Take 25 mg by mouth daily.  , Disp: , Rfl: ;  Multiple Vitamins-Minerals (CENTRUM SILVER) tablet, Take 1 tablet by mouth daily.  , Disp: , Rfl: ;  Multiple Vitamins-Minerals (ICAPS MV) TABS, Take 1 tablet by mouth daily.  , Disp: , Rfl:  omeprazole (PRILOSEC) 20 MG capsule, Take 20 mg by mouth daily.  , Disp: , Rfl: ;  pravastatin (PRAVACHOL) 20 MG tablet, Take 20 mg by mouth daily.  , Disp: , Rfl:   BP 156/79  Pulse 87   Resp 18  Ht 5\' 10"  (1.778 m)  Wt 142 lb (64.411 kg)  BMI 20.37 kg/m2  Body mass index is 20.37 kg/(m^2).           Review of Systems denies chest pain, does have dyspnea on exertion and orthopnea times and discomfort in his legs with walking. Slight instability with ambulation. Some numbness in his feet. Of systems negative    Objective:   Physical Exam blood pressure 156/79 heart rate 87 respirations 18 Gen.-alert and oriented x3 in no apparent distress HEENT normal for age Lungs no rhonchi or wheezing Cardiovascular regular rhythm no murmurs carotid pulses 3+ palpable no bruits audible Abdomen soft nontender no palpable masses Musculoskeletal free of  major deformities Skin clear -no rashes Neurologic normal Lower extremities 3+ femoral and dorsalis pedis pulses palpable bilaterally with no edema  [place] CT angiogram the abdomen and pelvis which I reviewed and interpreted. The small right common iliac artery aneurysm is totally unchanged from 2 years ago measuring about 21-22 mm in diameter. It is very diffuse and not focal. There is no thrombus within the wall.      Assessment:  Small right common iliac artery aneurysm-22 mm maximum diameter-no treatment indicated and no change in 2 years    Plan:     I do not feel that we need to continue to follow this small right common iliac artery aneurysmal segment in this 79 year old gentleman-will see him back as necessary Discuss this with patient and he is in agreement

## 2012-08-28 ENCOUNTER — Ambulatory Visit (INDEPENDENT_AMBULATORY_CARE_PROVIDER_SITE_OTHER): Payer: Medicare Other | Admitting: *Deleted

## 2012-08-28 DIAGNOSIS — I442 Atrioventricular block, complete: Secondary | ICD-10-CM

## 2012-08-28 LAB — PACEMAKER DEVICE OBSERVATION
AL AMPLITUDE: 4 mv
ATRIAL PACING PM: 37
BAMS-0001: 150 {beats}/min
DEVICE MODEL PM: 245369
RV LEAD IMPEDENCE PM: 885 Ohm
RV LEAD THRESHOLD: 0.5 V

## 2012-08-28 NOTE — Progress Notes (Signed)
PPM check in office. 

## 2012-09-14 ENCOUNTER — Encounter: Payer: Self-pay | Admitting: Internal Medicine

## 2012-11-26 ENCOUNTER — Other Ambulatory Visit: Payer: Self-pay | Admitting: Interventional Cardiology

## 2012-12-01 ENCOUNTER — Telehealth: Payer: Self-pay | Admitting: *Deleted

## 2012-12-01 NOTE — Telephone Encounter (Signed)
Pt states that his lasix was increased to 40 mg BID approx 3 weeks ago/ K+ 10 meq daily. Pt states his "SOB is not getting better and would like to be seen again but is not urgent". Pt does not get daily wts/ high sodium foods to avoid were reviewed,  No SOB was noted in his speech, App was given with Dr Eldridge Dace for Monday 10/13, pt agreed to plan.

## 2012-12-04 ENCOUNTER — Ambulatory Visit (INDEPENDENT_AMBULATORY_CARE_PROVIDER_SITE_OTHER): Payer: Medicare Other | Admitting: Interventional Cardiology

## 2012-12-04 ENCOUNTER — Encounter: Payer: Self-pay | Admitting: Interventional Cardiology

## 2012-12-04 VITALS — BP 132/84 | HR 80 | Ht 70.0 in | Wt 146.0 lb

## 2012-12-04 DIAGNOSIS — R0602 Shortness of breath: Secondary | ICD-10-CM

## 2012-12-04 DIAGNOSIS — I2510 Atherosclerotic heart disease of native coronary artery without angina pectoris: Secondary | ICD-10-CM

## 2012-12-04 DIAGNOSIS — I10 Essential (primary) hypertension: Secondary | ICD-10-CM

## 2012-12-04 LAB — BASIC METABOLIC PANEL
BUN: 23 mg/dL (ref 6–23)
Calcium: 9.2 mg/dL (ref 8.4–10.5)
GFR: 62.77 mL/min (ref >60.00)
Glucose, Bld: 98 mg/dL (ref 70–99)

## 2012-12-04 MED ORDER — POTASSIUM CHLORIDE ER 10 MEQ PO TBCR: 10.0000 meq | EXTENDED_RELEASE_TABLET | Freq: Every day | ORAL | Status: DC

## 2012-12-04 NOTE — Progress Notes (Signed)
Patient ID: [name] [name], male   DOB: 1920-09-01, 79 y.o.   MRN: [id number]    [address] [place], [place]  [address] Phone: [phone] Fax:  [phone]  Date:  12/04/2012   ID:  [name] [name], DOB [birth year], MRN [id number]  PCP:  Lillia Mountain, MD      History of Present Illness: [name] [name] is a 79 y.o. male with CAD and pacemaker placement in 2011. He feels that he does not have a lot of energy. Wife had dementia but recently passed in September 2014. CAD/ASCVD:  He walks in the house and shopping. HE does not do much dedicated exercise.  His balance is still an issue. BP has been 130-140s systolic. c/o Dyspnea on exertion worse. Elevated BNP at last check (mild 186) in September 2014.  Lasix was increased 40 mg BID.  No significant improvement.  Denies : Chest pain.  Dizziness.  Leg edema.  Nitroglycerin.  Orthopnea.  Palpitations.  Syncope.     Wt Readings from Last 3 Encounters:  12/04/12 146 lb (66.225 kg)  08/08/12 142 lb (64.411 kg)  03/07/12 147 lb 6.4 oz (66.86 kg)     Past Medical History  Diagnosis Date  . Coronary artery disease   . Iliac artery aneurysm, right   . Hypertension   . GERD (gastroesophageal reflux disease)   . Prostate cancer   . SVT (supraventricular tachycardia)   . Hearing loss   . Complete heart block     Symptomatic    Current Outpatient Prescriptions  Medication Sig Dispense Refill  . aspirin 81 MG EC tablet Take 81 mg by mouth daily.        Marland Kitchen b complex vitamins tablet Take 1 tablet by mouth daily.        . clopidogrel (PLAVIX) 75 MG tablet Take 75 mg by mouth daily.        . furosemide (LASIX) 40 MG tablet Take 80 mg by mouth daily.       . hydrochlorothiazide 25 MG tablet Take 25 mg by mouth daily.       Marland Kitchen losartan (COZAAR) 100 MG tablet TAKE 1 TABLET DAILY  90 tablet  2  . M2 Zinc-50 50 MG TABS Take 1 tablet by mouth daily.        . metoprolol tartrate (LOPRESSOR) 25 MG tablet Take 25 mg by mouth  daily.        . Multiple Vitamins-Minerals (CENTRUM SILVER) tablet Take 1 tablet by mouth daily.        . Multiple Vitamins-Minerals (ICAPS MV) TABS Take 1 tablet by mouth daily.        Marland Kitchen omeprazole (PRILOSEC) 20 MG capsule Take 20 mg by mouth daily.        . pravastatin (PRAVACHOL) 20 MG tablet Take 20 mg by mouth daily.         No current facility-administered medications for this visit.    Allergies:    Allergies  Allergen Reactions  . Penicillins     Social History:  The patient  reports that he quit smoking about 50 years ago. His smoking use included Cigarettes. He smoked 0.00 packs per day. He has never used smokeless tobacco. He reports that he does not drink alcohol or use illicit drugs.   Family History:  The patient's family history is not on file.   ROS:  Please see the history of present illness.  No nausea, vomiting.  No fevers, chills.  No focal  weakness.  No dysuria. Worsening shortness of breath. Decreased exercise tolerance.  All other systems reviewed and negative.   PHYSICAL EXAM: VS:  BP 132/84  Pulse 80  Ht 5\' 10"  (1.778 m)  Wt 146 lb (66.225 kg)  BMI 20.95 kg/m2 Well nourished, well developed, in no acute distress HEENT: normal Neck: no JVD, no carotid bruits Cardiac:  normal S1, S2; RRR; 1/6 systolic murmur Lungs:  clear to auscultation bilaterally, no wheezing, rhonchi or rales Abd: soft, nontender, no hepatomegaly Ext: no edema Skin: warm and dry Neuro:   no focal abnormalities noted   ASSESSMENT AND PLAN:  Coronary atherosclerosis of native coronary artery  Continue Aspirin Tablet Chewable, 81 MG, 1 tablet, Orally, Once a day Continue Plavix Tablet, 75 MG, 1 tablet, Orally, Once a day IMAGING: EKG   Overton,Shana 10/24/2012 01:36:11 PM > Quintus Premo,JAY 10/24/2012 02:02:11 PM > Atrial sensing, ventricular pacing   Notes: No angina. Taxus stent in the mid LAD. No bleeding problems. s/p pacer.  2. Unspecified peripheral vascular disease  Notes:  Right iliac aneurysm, 22 mm by last CT scan.  3. Mixed hyperlipidemia  Continue Pravastatin Sodium Tablet, 20MG , TAKE 1 TABLET DAILY Notes: LDL target < 100. LDL 67 in July 2014.  4. Hypertension, essential  Continue Furosemide Tablet, 40 MG, 1 tablet, Orally, Once a day, 90 day(s), 90, Refills 3 Notes: Swelling better on diuretic.  Adjust dose based on BNP. Patient Educated with: [facility] BP Action plan.pdf ([facility] BP Action plan.pdf).  5. Shortness of breath  6.  Cardiomyopathy: mitral regurgitation/ tricuspid regurgitation.: LVEF 50-55%,  Mild- moderate MR, moderate TR.   LAB: BNP Notes: Check for fluid overload.    Signed, Fredric Mare, MD, [facility] 12/04/2012 11:56 AM

## 2012-12-04 NOTE — Patient Instructions (Signed)
Your physician has requested that you have an echocardiogram. Echocardiography is a painless test that uses sound waves to create images of your heart. It provides your doctor with information about the size and shape of your heart and how well your heart's chambers and valves are working. This procedure takes approximately one hour. There are no restrictions for this procedure.  Your physician recommends that you go to the lab today to for BNP and Bmet.

## 2012-12-19 ENCOUNTER — Other Ambulatory Visit (HOSPITAL_COMMUNITY): Payer: Self-pay | Admitting: Interventional Cardiology

## 2012-12-19 ENCOUNTER — Ambulatory Visit (HOSPITAL_COMMUNITY): Payer: Medicare Other | Attending: Cardiology

## 2012-12-19 DIAGNOSIS — I428 Other cardiomyopathies: Secondary | ICD-10-CM | POA: Insufficient documentation

## 2012-12-19 DIAGNOSIS — R0609 Other forms of dyspnea: Secondary | ICD-10-CM | POA: Insufficient documentation

## 2012-12-19 DIAGNOSIS — R0602 Shortness of breath: Secondary | ICD-10-CM

## 2012-12-19 DIAGNOSIS — I2510 Atherosclerotic heart disease of native coronary artery without angina pectoris: Secondary | ICD-10-CM | POA: Insufficient documentation

## 2012-12-19 DIAGNOSIS — I739 Peripheral vascular disease, unspecified: Secondary | ICD-10-CM | POA: Insufficient documentation

## 2012-12-19 DIAGNOSIS — I059 Rheumatic mitral valve disease, unspecified: Secondary | ICD-10-CM | POA: Insufficient documentation

## 2012-12-19 DIAGNOSIS — I10 Essential (primary) hypertension: Secondary | ICD-10-CM | POA: Insufficient documentation

## 2012-12-19 DIAGNOSIS — I359 Nonrheumatic aortic valve disorder, unspecified: Secondary | ICD-10-CM | POA: Insufficient documentation

## 2012-12-19 DIAGNOSIS — I379 Nonrheumatic pulmonary valve disorder, unspecified: Secondary | ICD-10-CM | POA: Insufficient documentation

## 2012-12-19 DIAGNOSIS — I498 Other specified cardiac arrhythmias: Secondary | ICD-10-CM | POA: Insufficient documentation

## 2012-12-19 DIAGNOSIS — Z87891 Personal history of nicotine dependence: Secondary | ICD-10-CM | POA: Insufficient documentation

## 2012-12-19 DIAGNOSIS — R0989 Other specified symptoms and signs involving the circulatory and respiratory systems: Secondary | ICD-10-CM | POA: Insufficient documentation

## 2012-12-19 NOTE — Progress Notes (Signed)
Echocardiogram performed.  

## 2012-12-26 ENCOUNTER — Telehealth: Payer: Self-pay | Admitting: Interventional Cardiology

## 2012-12-26 DIAGNOSIS — Z79899 Other long term (current) drug therapy: Secondary | ICD-10-CM

## 2012-12-26 NOTE — Telephone Encounter (Signed)
New message    Pt want test results----was seen a few days ago

## 2012-12-26 NOTE — Telephone Encounter (Signed)
Called pt to give him results again.

## 2012-12-27 NOTE — Telephone Encounter (Addendum)
Pt notified of Echo results. Pt is still having intermittent SOB with exertion. Pt is taking Lasix 40mg  BID currently. Pt feels the SOB has been this way for quite sometime and it only worsens when he exerts himself more than normal.

## 2012-12-28 ENCOUNTER — Telehealth: Payer: Self-pay | Admitting: Interventional Cardiology

## 2012-12-28 NOTE — Telephone Encounter (Signed)
No change on management.  BMet in a month. v58.69

## 2012-12-28 NOTE — Telephone Encounter (Signed)
Returned pt's call.

## 2012-12-28 NOTE — Telephone Encounter (Signed)
lmtrc

## 2012-12-28 NOTE — Telephone Encounter (Signed)
New message ° ° ° ° °Returned Amy's call °

## 2013-01-01 NOTE — Addendum Note (Signed)
Addended byOrlene Plum H on: 01/01/2013 01:48 PM   Modules accepted: Orders

## 2013-01-01 NOTE — Telephone Encounter (Signed)
Pt notified of lab appt on 01/29/13.

## 2013-01-13 ENCOUNTER — Other Ambulatory Visit: Payer: Self-pay | Admitting: Interventional Cardiology

## 2013-01-17 ENCOUNTER — Other Ambulatory Visit: Payer: Self-pay | Admitting: Interventional Cardiology

## 2013-01-29 ENCOUNTER — Other Ambulatory Visit (INDEPENDENT_AMBULATORY_CARE_PROVIDER_SITE_OTHER): Payer: Medicare Other

## 2013-01-29 DIAGNOSIS — Z79899 Other long term (current) drug therapy: Secondary | ICD-10-CM

## 2013-01-29 LAB — BASIC METABOLIC PANEL
BUN: 27 mg/dL — ABNORMAL HIGH (ref 6–23)
CO2: 30 mEq/L (ref 19–32)
Calcium: 9.1 mg/dL (ref 8.4–10.5)
Chloride: 104 mEq/L (ref 96–112)
Creatinine, Ser: 1.3 mg/dL (ref 0.4–1.5)
Glucose, Bld: 84 mg/dL (ref 70–99)
Sodium: 142 mEq/L (ref 135–145)

## 2013-03-13 ENCOUNTER — Encounter: Payer: Self-pay | Admitting: Internal Medicine

## 2013-03-13 ENCOUNTER — Ambulatory Visit (INDEPENDENT_AMBULATORY_CARE_PROVIDER_SITE_OTHER): Payer: Medicare Other | Admitting: Internal Medicine

## 2013-03-13 VITALS — BP 130/82 | HR 86 | Ht 67.5 in | Wt 154.0 lb

## 2013-03-13 DIAGNOSIS — I2510 Atherosclerotic heart disease of native coronary artery without angina pectoris: Secondary | ICD-10-CM

## 2013-03-13 DIAGNOSIS — I442 Atrioventricular block, complete: Secondary | ICD-10-CM

## 2013-03-13 DIAGNOSIS — Z950 Presence of cardiac pacemaker: Secondary | ICD-10-CM

## 2013-03-13 DIAGNOSIS — I10 Essential (primary) hypertension: Secondary | ICD-10-CM

## 2013-03-13 LAB — MDC_IDC_ENUM_SESS_TYPE_INCLINIC
Battery Impedance: 398 Ohm
Battery Remaining Longevity: 87 mo
Battery Voltage: 2.79 V
Brady Statistic AP VP Percent: 32 %
Date Time Interrogation Session: 20150120102204
Lead Channel Impedance Value: 447 Ohm
Lead Channel Impedance Value: 877 Ohm
Lead Channel Pacing Threshold Amplitude: 0.5 V
Lead Channel Pacing Threshold Pulse Width: 0.4 ms
Lead Channel Pacing Threshold Pulse Width: 0.4 ms
Lead Channel Setting Pacing Amplitude: 2 V
Lead Channel Setting Pacing Amplitude: 2.5 V
Lead Channel Setting Pacing Pulse Width: 0.4 ms
Lead Channel Setting Sensing Sensitivity: 2.8 mV
MDC IDC MSMT LEADCHNL RA PACING THRESHOLD AMPLITUDE: 0.5 V
MDC IDC MSMT LEADCHNL RA SENSING INTR AMPL: 4 mV
MDC IDC STAT BRADY AP VS PERCENT: 0 %
MDC IDC STAT BRADY AS VP PERCENT: 67 %
MDC IDC STAT BRADY AS VS PERCENT: 1 %

## 2013-03-13 NOTE — Assessment & Plan Note (Signed)
His Medtronic dual-chamber pacemaker is working normally. We'll plan to recheck in several months.

## 2013-03-13 NOTE — Assessment & Plan Note (Signed)
The patient does have exertional chest tightness which is very mild. He'll continue his current medications, and call his primary cardiologist if his symptoms worsen.

## 2013-03-13 NOTE — Assessment & Plan Note (Signed)
His blood pressure today is well controlled. He'll continue his current medical therapy, and maintain a low-sodium diet.

## 2013-03-13 NOTE — Patient Instructions (Signed)
Your physician wants you to follow-up in: 6 months in the device clinic 12 months with Dr Knox Saliva will receive a reminder letter in the mail two months in advance. If you don't receive a letter, please call our office to schedule the follow-up appointment.

## 2013-03-13 NOTE — Progress Notes (Signed)
HPI [name] returns today for followup. He is a very pleasant 80 year old man with symptomatic bradycardia, status post permanent pacemaker insertion, coronary artery disease, hypertension, and dyslipidemia. In the interim he has done well. The patient denies peripheral edema, chest pain, or shortness of breath. He has recently lost his wife of 85 years.  He denies syncope. He has very minimal dyspnea with exertion and chest tightness, when he exerts himself.  Allergies  Allergen Reactions  . Penicillins      Current Outpatient Prescriptions  Medication Sig Dispense Refill  . aspirin 81 MG EC tablet Take 81 mg by mouth daily.        Marland Kitchen b complex vitamins tablet Take 1 tablet by mouth daily.        . clopidogrel (PLAVIX) 75 MG tablet TAKE 1 TABLET DAILY  90 tablet  2  . furosemide (LASIX) 40 MG tablet Take 40 mg by mouth 2 (two) times daily.       Marland Kitchen losartan (COZAAR) 100 MG tablet TAKE 1 TABLET DAILY  90 tablet  2  . M2 Zinc-50 50 MG TABS Take 1 tablet by mouth daily.        . metoprolol succinate (TOPROL-XL) 25 MG 24 hr tablet TAKE 1 TABLET DAILY  90 tablet  3  . Multiple Vitamins-Minerals (CENTRUM SILVER) tablet Take 1 tablet by mouth daily.        . Multiple Vitamins-Minerals (ICAPS MV) TABS Take 1 tablet by mouth daily.        Marland Kitchen omeprazole (PRILOSEC) 20 MG capsule Take 20 mg by mouth daily.        . potassium chloride (K-DUR) 10 MEQ tablet Take 1 tablet (10 mEq total) by mouth daily.  90 tablet  3  . pravastatin (PRAVACHOL) 20 MG tablet TAKE 1 TABLET DAILY  90 tablet  1   No current facility-administered medications for this visit.     Past Medical History  Diagnosis Date  . Coronary artery disease   . Iliac artery aneurysm, right   . Hypertension   . GERD (gastroesophageal reflux disease)   . Prostate cancer   . SVT (supraventricular tachycardia)   . Hearing loss   . Complete heart block     Symptomatic    ROS:   All systems reviewed and negative except as noted in the  HPI.   Past Surgical History  Procedure Laterality Date  . Insert / replace / remove pacemaker  01/20/10    dual-chamber permanent pacemaker implantation by Dr. Cristopher Peru  . Rotator cuff repair      Right  . Inguinal hernia repair      Right  . Kidney stone surgery      Left-sided  . Cataract extraction      Right eye  . Cardiac catheterization  2007     Family History  Problem Relation Age of Onset  . Breast cancer Mother   . Hypertension Father   . CVA Father   . Prostate cancer Brother   . Prostate cancer Brother   . Cancer Sister     esophageal     History   Social History  . Marital Status: Widowed    Spouse Name: N/A    Number of Children: N/A  . Years of Education: N/A   Occupational History  . Retired    Social History Main Topics  . Smoking status: Former Smoker    Types: Cigarettes    Quit date: 08/09/1962  . Smokeless  tobacco: Never Used     Comment: Quit smoking many years ago.  . Alcohol Use: No  . Drug Use: No  . Sexual Activity: Not on file   Other Topics Concern  . Not on file   Social History Narrative  . No narrative on file     BP 130/82  Pulse 86  Ht 5' 7.5" (1.715 m)  Wt 154 lb (69.854 kg)  BMI 23.75 kg/m2  Physical Exam:  Well appearing 80 year old man, NAD HEENT: Unremarkable Neck:  No JVD, no thyromegally Lungs:  Clear with no wheezes, rales, or rhonchi. Well-healed pacemaker incision HEART:  Regular rate rhythm, no murmurs, no rubs, no clicks Abd:  soft, positive bowel sounds, no organomegally, no rebound, no guarding Ext:  2 plus pulses, no edema, no cyanosis, no clubbing Skin:  No rashes no nodules Neuro:  CN II through XII intact, motor grossly intact  DEVICE  Normal device function.  See PaceArt for details.   Assess/Plan:

## 2013-06-12 ENCOUNTER — Encounter: Payer: Self-pay | Admitting: Interventional Cardiology

## 2013-08-01 ENCOUNTER — Other Ambulatory Visit: Payer: Self-pay | Admitting: Interventional Cardiology

## 2013-08-22 ENCOUNTER — Other Ambulatory Visit: Payer: Self-pay | Admitting: Interventional Cardiology

## 2013-09-10 ENCOUNTER — Ambulatory Visit (INDEPENDENT_AMBULATORY_CARE_PROVIDER_SITE_OTHER): Payer: Medicare Other | Admitting: *Deleted

## 2013-09-10 ENCOUNTER — Encounter: Payer: Self-pay | Admitting: Interventional Cardiology

## 2013-09-10 DIAGNOSIS — I442 Atrioventricular block, complete: Secondary | ICD-10-CM

## 2013-09-10 DIAGNOSIS — Z950 Presence of cardiac pacemaker: Secondary | ICD-10-CM

## 2013-09-10 LAB — MDC_IDC_ENUM_SESS_TYPE_INCLINIC
Battery Remaining Longevity: 82 mo
Brady Statistic AP VP Percent: 33 %
Brady Statistic AS VP Percent: 65 %
Brady Statistic AS VS Percent: 2 %
Lead Channel Impedance Value: 898 Ohm
Lead Channel Pacing Threshold Amplitude: 0.5 V
Lead Channel Sensing Intrinsic Amplitude: 11.2 mV
Lead Channel Sensing Intrinsic Amplitude: 4 mV
Lead Channel Setting Pacing Amplitude: 2 V
Lead Channel Setting Pacing Amplitude: 2.5 V
MDC IDC MSMT BATTERY IMPEDANCE: 473 Ohm
MDC IDC MSMT BATTERY VOLTAGE: 2.78 V
MDC IDC MSMT LEADCHNL RA IMPEDANCE VALUE: 496 Ohm
MDC IDC MSMT LEADCHNL RA PACING THRESHOLD AMPLITUDE: 0.5 V
MDC IDC MSMT LEADCHNL RA PACING THRESHOLD PULSEWIDTH: 0.4 ms
MDC IDC MSMT LEADCHNL RV PACING THRESHOLD PULSEWIDTH: 0.4 ms
MDC IDC SESS DTM: 20150720171343
MDC IDC SET LEADCHNL RV PACING PULSEWIDTH: 0.4 ms
MDC IDC SET LEADCHNL RV SENSING SENSITIVITY: 2.8 mV
MDC IDC STAT BRADY AP VS PERCENT: 0 %

## 2013-09-10 NOTE — Progress Notes (Signed)
Pacemaker check in clinic. Normal device function. Thresholds, sensing, impedances consistent with previous measurements. Device programmed to maximize longevity. No mode switches. 1 NSVT---7 beats. Device programmed at appropriate safety margins. Histogram distribution appropriate for patient activity level. Device programmed to optimize intrinsic conduction. Estimated longevity 6.43yrs. ROV w/ Dr. Lovena Le in 47mo.

## 2013-09-26 ENCOUNTER — Other Ambulatory Visit: Payer: Self-pay | Admitting: *Deleted

## 2013-09-26 ENCOUNTER — Other Ambulatory Visit: Payer: Self-pay | Admitting: Interventional Cardiology

## 2013-10-17 ENCOUNTER — Other Ambulatory Visit: Payer: Self-pay | Admitting: Cardiology

## 2013-10-17 MED ORDER — PRAVASTATIN SODIUM 20 MG PO TABS: 20.0000 mg | ORAL_TABLET | Freq: Every day | ORAL | Status: DC

## 2013-10-18 ENCOUNTER — Encounter: Payer: Self-pay | Admitting: Internal Medicine

## 2013-10-28 ENCOUNTER — Other Ambulatory Visit: Payer: Self-pay | Admitting: Interventional Cardiology

## 2013-10-31 ENCOUNTER — Ambulatory Visit: Payer: Medicare Other | Admitting: Interventional Cardiology

## 2013-11-13 ENCOUNTER — Ambulatory Visit (INDEPENDENT_AMBULATORY_CARE_PROVIDER_SITE_OTHER): Payer: Medicare Other | Admitting: Interventional Cardiology

## 2013-11-13 ENCOUNTER — Encounter: Payer: Self-pay | Admitting: Interventional Cardiology

## 2013-11-13 VITALS — BP 130/80 | HR 73 | Ht 68.0 in | Wt 152.0 lb

## 2013-11-13 DIAGNOSIS — I723 Aneurysm of iliac artery: Secondary | ICD-10-CM

## 2013-11-13 DIAGNOSIS — I10 Essential (primary) hypertension: Secondary | ICD-10-CM

## 2013-11-13 DIAGNOSIS — I2510 Atherosclerotic heart disease of native coronary artery without angina pectoris: Secondary | ICD-10-CM

## 2013-11-13 DIAGNOSIS — Z950 Presence of cardiac pacemaker: Secondary | ICD-10-CM

## 2013-11-13 NOTE — Progress Notes (Signed)
Patient ID: Damyen Knoll, male   DOB: 23-Dec-1920, 80 y.o.   MRN: [id number] Patient ID: Jasmin Trumbull, male   DOB: 1920-06-16, 80 y.o.   MRN: [id number]    [place] [place], [place]  [address] Phone: [phone] Fax:  [phone]  Date:  11/13/2013   ID:  Giacomo Valone, DOB [birth year], MRN [id number]  PCP:  Irven Shelling, MD      History of Present Illness: Montrel Donahoe is a 80 y.o. male with CAD and pacemaker placement in 2011. He feels that he does not have a lot of energy. Wife had dementia but recently passed in September 2014. CAD/ASCVD:  He walks in the house and shopping. HE does not do much dedicated exercise.  His balance is still an issue. BP has been 532-992E systolic. c/o Dyspnea on exertion worse. Elevated BNP at last check (mild 186) in September 2014.  Lasix was increased 40 mg BID.  No significant improvement.  Denies : Chest pain.  Dizziness.  Leg edema.  Nitroglycerin.  Orthopnea.  Palpitations.  Syncope.     Wt Readings from Last 3 Encounters:  11/13/13 152 lb (68.947 kg)  03/13/13 154 lb (69.854 kg)  12/04/12 146 lb (66.225 kg)     Past Medical History  Diagnosis Date  . Coronary artery disease   . Iliac artery aneurysm, right   . Hypertension   . GERD (gastroesophageal reflux disease)   . Prostate cancer   . SVT (supraventricular tachycardia)   . Hearing loss   . Complete heart block     Symptomatic    Current Outpatient Prescriptions  Medication Sig Dispense Refill  . acetaminophen (TYLENOL) 500 MG tablet Take 500 mg by mouth every 6 (six) hours as needed.      Marland Kitchen aspirin 81 MG EC tablet Take 81 mg by mouth daily.        Marland Kitchen b complex vitamins tablet Take 1 tablet by mouth daily.        . clopidogrel (PLAVIX) 75 MG tablet TAKE 1 TABLET DAILY  90 tablet  0  . fluticasone (FLONASE) 50 MCG/ACT nasal spray Place 2 sprays into both nostrils daily as needed.      . furosemide (LASIX) 40 MG tablet TAKE 1 TABLET TWICE A DAY  180  tablet  0  . losartan (COZAAR) 100 MG tablet TAKE 1 TABLET DAILY  90 tablet  1  . metoprolol succinate (TOPROL-XL) 25 MG 24 hr tablet TAKE 1 TABLET DAILY  90 tablet  3  . Multiple Vitamins-Minerals (CENTRUM SILVER) tablet Take 1 tablet by mouth daily.        . Multiple Vitamins-Minerals (ICAPS MV) TABS Take 1 tablet by mouth daily.        Marland Kitchen omeprazole (PRILOSEC) 20 MG capsule Take 20 mg by mouth daily.        . potassium chloride (K-DUR) 10 MEQ tablet Take 1 tablet (10 mEq total) by mouth daily.  90 tablet  3  . pravastatin (PRAVACHOL) 20 MG tablet Take 1 tablet (20 mg total) by mouth daily.  90 tablet  1  . M2 Zinc-50 50 MG TABS Take 1 tablet by mouth daily.         No current facility-administered medications for this visit.    Allergies:    Allergies  Allergen Reactions  . Penicillins     Social History:  The patient  reports that he quit smoking about 51 years ago. His smoking use included Cigarettes.  He smoked 0.00 packs per day. He has never used smokeless tobacco. He reports that he does not drink alcohol or use illicit drugs.   Family History:  The patient's family history includes Breast cancer in his mother; CVA in his father; Cancer in his sister; Hypertension in his father; Prostate cancer in his brother and brother.   ROS:  Please see the history of present illness.  No nausea, vomiting.  No fevers, chills.  No focal weakness.  No dysuria. Worsening shortness of breath. Decreased exercise tolerance.  All other systems reviewed and negative.   PHYSICAL EXAM: VS:  BP 130/80  Pulse 73  Ht 5\' 8"  (1.727 m)  Wt 152 lb (68.947 kg)  BMI 23.12 kg/m2 Well nourished, well developed, in no acute distress HEENT: normal Neck: no JVD, no carotid bruits Cardiac:  normal S1, S2; RRR; 1/6 systolic murmur Lungs:  clear to auscultation bilaterally, no wheezing, rhonchi or rales Abd: soft, nontender, no hepatomegaly Ext: no edema Skin: warm and dry Neuro:   no focal abnormalities  noted   ASSESSMENT AND PLAN:  Coronary atherosclerosis of native coronary artery  Continue Aspirin Tablet Chewable, 81 MG, 1 tablet, Orally, Once a day Continue Plavix Tablet, 75 MG, 1 tablet, Orally, Once a day IMAGING: EKG   Overton,Shana 10/24/2012 01:36:11 PM > Jameya Pontiff,JAY 10/24/2012 02:02:11 PM > Atrial sensing, ventricular pacing   Notes: No angina. Taxus stent in the mid LAD. No bleeding problems. s/p pacer.  COuld stop aspirin if blleding issues 2. Unspecified peripheral vascular disease  Notes: Right iliac aneurysm, 22 mm by last CT scan.  Not repeating imaging at this time.  3. Mixed hyperlipidemia  Continue Pravastatin Sodium Tablet, 20MG , TAKE 1 TABLET DAILY Notes: LDL target < 100. LDL 67 in July 2014.  4. Hypertension, essential  Continue Furosemide Tablet, 40 MG, 1 tablet, Orally, Once a day, 90 day(s), 90, Refills 3 Notes: Swelling better on diuretic.  BNP 186 in 9/14  5. Shortness of breath -DOE; most likely due to deconditioning.  Activity limited by neuropathy and lack of exercise. 6.  Cardiomyopathy: mitral regurgitation/ tricuspid regurgitation.: LVEF 45-50%,  Mild- moderate MR, moderate TR.  No overt heart failure at this time.   Continue routine pacer checks.     Signed, Mina Marble, MD, [facility] 11/13/2013 11:32 AM

## 2013-11-13 NOTE — Patient Instructions (Signed)
Your physician has recommended you make the following change in your medication:   1. Stop Aspirin.  Your physician wants you to follow-up in: 1 year with Dr. Irish Lack. You will receive a reminder letter in the mail two months in advance. If you don't receive a letter, please call our office to schedule the follow-up appointment.

## 2013-12-27 ENCOUNTER — Other Ambulatory Visit: Payer: Self-pay

## 2013-12-27 MED ORDER — FUROSEMIDE 40 MG PO TABS: ORAL_TABLET | ORAL | Status: DC

## 2014-01-06 ENCOUNTER — Other Ambulatory Visit: Payer: Self-pay | Admitting: Interventional Cardiology

## 2014-01-26 ENCOUNTER — Other Ambulatory Visit: Payer: Self-pay | Admitting: Interventional Cardiology

## 2014-01-26 NOTE — Telephone Encounter (Signed)
Rx was sent to pharmacy electronically. 

## 2014-03-19 ENCOUNTER — Ambulatory Visit (INDEPENDENT_AMBULATORY_CARE_PROVIDER_SITE_OTHER): Payer: Medicare Other | Admitting: Internal Medicine

## 2014-03-19 ENCOUNTER — Encounter: Payer: Self-pay | Admitting: Internal Medicine

## 2014-03-19 ENCOUNTER — Telehealth: Payer: Self-pay | Admitting: Cardiology

## 2014-03-19 VITALS — BP 130/82 | HR 82 | Ht 67.5 in | Wt 156.0 lb

## 2014-03-19 DIAGNOSIS — Z950 Presence of cardiac pacemaker: Secondary | ICD-10-CM

## 2014-03-19 DIAGNOSIS — I5032 Chronic diastolic (congestive) heart failure: Secondary | ICD-10-CM

## 2014-03-19 DIAGNOSIS — I10 Essential (primary) hypertension: Secondary | ICD-10-CM

## 2014-03-19 DIAGNOSIS — I442 Atrioventricular block, complete: Secondary | ICD-10-CM

## 2014-03-19 LAB — MDC_IDC_ENUM_SESS_TYPE_INCLINIC
Battery Remaining Longevity: 74 mo
Brady Statistic AP VS Percent: 0 %
Brady Statistic AS VP Percent: 61 %
Brady Statistic AS VS Percent: 1 %
Date Time Interrogation Session: 20160126094230
Lead Channel Impedance Value: 474 Ohm
Lead Channel Impedance Value: 920 Ohm
Lead Channel Pacing Threshold Pulse Width: 0.4 ms
Lead Channel Pacing Threshold Pulse Width: 0.4 ms
Lead Channel Sensing Intrinsic Amplitude: 11.2 mV
Lead Channel Setting Pacing Amplitude: 2 V
Lead Channel Setting Pacing Amplitude: 2.5 V
Lead Channel Setting Sensing Sensitivity: 2.8 mV
MDC IDC MSMT BATTERY IMPEDANCE: 575 Ohm
MDC IDC MSMT BATTERY VOLTAGE: 2.78 V
MDC IDC MSMT LEADCHNL RA PACING THRESHOLD AMPLITUDE: 1 V
MDC IDC MSMT LEADCHNL RA SENSING INTR AMPL: 5.6 mV
MDC IDC MSMT LEADCHNL RV PACING THRESHOLD AMPLITUDE: 0.5 V
MDC IDC SET LEADCHNL RV PACING PULSEWIDTH: 0.4 ms
MDC IDC STAT BRADY AP VP PERCENT: 37 %

## 2014-03-19 NOTE — Patient Instructions (Signed)
Your physician wants you to follow-up in: 6 months in the device clinic and 12 months with Dr. Knox Saliva will receive a reminder letter in the mail two months in advance. If you don't receive a letter, please call our office to schedule the follow-up appointment.

## 2014-03-19 NOTE — Assessment & Plan Note (Signed)
His symptoms are class 1. He will continue his current meds and daily activity. I will see him back in a year.

## 2014-03-19 NOTE — Progress Notes (Signed)
HPI [name] [name] returns today for followup. He is a very pleasant 81 year old man with symptomatic bradycardia, status post permanent pacemaker insertion, coronary artery disease, hypertension, and dyslipidemia. In the interim he has done well. The patient denies peripheral edema, chest pain, or shortness of breath.  He denies syncope. He has very minimal dyspnea with exertion and chest tightness, when he exerts himself. This is unchanged. Allergies  Allergen Reactions  . Penicillins Anaphylaxis     Current Outpatient Prescriptions  Medication Sig Dispense Refill  . acetaminophen (TYLENOL) 500 MG tablet Take 500 mg by mouth every 6 (six) hours as needed (pain).     Marland Kitchen b complex vitamins tablet Take 1 tablet by mouth daily.      . clopidogrel (PLAVIX) 75 MG tablet Take 1 tablet (75 mg total) by mouth daily. 90 tablet 2  . furosemide (LASIX) 40 MG tablet TAKE 1 TABLET TWICE A DAY (Patient taking differently: TAKE 1 TABLET BY MOUTH TWICE A DAY) 180 tablet 1  . losartan (COZAAR) 100 MG tablet TAKE 1 TABLET DAILY (Patient taking differently: TAKE 1 TABLET BY MOUTH DAILY) 90 tablet 2  . metoprolol succinate (TOPROL-XL) 25 MG 24 hr tablet Take 1 tablet (25 mg total) by mouth daily. 90 tablet 2  . Multiple Vitamins-Minerals (CENTRUM SILVER) tablet Take 1 tablet by mouth daily.      . Multiple Vitamins-Minerals (ICAPS MV) TABS Take 1 tablet by mouth daily.      Marland Kitchen omeprazole (PRILOSEC) 20 MG capsule Take 20 mg by mouth daily.      . potassium chloride (MICRO-K) 10 MEQ CR capsule Take 1 capsule by mouth daily.    . pravastatin (PRAVACHOL) 20 MG tablet Take 1 tablet (20 mg total) by mouth daily. 90 tablet 1   No current facility-administered medications for this visit.     Past Medical History  Diagnosis Date  . Coronary artery disease   . Iliac artery aneurysm, right   . Hypertension   . GERD (gastroesophageal reflux disease)   . Prostate cancer   . SVT (supraventricular tachycardia)   . Hearing  loss   . Complete heart block     Symptomatic    ROS:   All systems reviewed and negative except as noted in the HPI.   Past Surgical History  Procedure Laterality Date  . Insert / replace / remove pacemaker  01/20/10    dual-chamber permanent pacemaker implantation by Dr. Cristopher Peru  . Rotator cuff repair      Right  . Inguinal hernia repair      Right  . Kidney stone surgery      Left-sided  . Cataract extraction      Right eye  . Cardiac catheterization  2007     Family History  Problem Relation Age of Onset  . Breast cancer Mother   . Hypertension Father   . CVA Father   . Prostate cancer Brother   . Prostate cancer Brother   . Cancer Sister     esophageal     History   Social History  . Marital Status: Widowed    Spouse Name: N/A    Number of Children: N/A  . Years of Education: N/A   Occupational History  . Retired    Social History Main Topics  . Smoking status: Former Smoker    Types: Cigarettes    Quit date: 08/09/1962  . Smokeless tobacco: Never Used     Comment: Quit smoking many years ago.  Marland Kitchen  Alcohol Use: No  . Drug Use: No  . Sexual Activity: Not on file   Other Topics Concern  . Not on file   Social History Narrative     BP 130/82 mmHg  Pulse 82  Ht 5' 7.5" (1.715 m)  Wt 156 lb (70.761 kg)  BMI 24.06 kg/m2  Physical Exam:  Well appearing 81 year old man, NAD HEENT: Unremarkable Neck:  No JVD, no thyromegally Lungs:  Clear with no wheezes, rales, or rhonchi. Well-healed pacemaker incision HEART:  Regular rate rhythm, no murmurs, no rubs, no clicks Abd:  soft, positive bowel sounds, no organomegally, no rebound, no guarding Ext:  2 plus pulses, no edema, no cyanosis, no clubbing Skin:  No rashes no nodules Neuro:  CN II through XII intact, motor grossly intact  DEVICE  Normal device function.  See PaceArt for details.   Assess/Plan:

## 2014-03-19 NOTE — Telephone Encounter (Signed)
Pt refused monitor upgrade. Pt wants to continue to come into office to have device checked.

## 2014-03-19 NOTE — Assessment & Plan Note (Signed)
His Medtronic DDD PM is working normally. Will recheck in several months. 

## 2014-03-19 NOTE — Assessment & Plan Note (Signed)
His pressures are well controlled. No change in meds.

## 2014-03-23 ENCOUNTER — Other Ambulatory Visit: Payer: Self-pay | Admitting: Interventional Cardiology

## 2014-06-21 ENCOUNTER — Other Ambulatory Visit: Payer: Self-pay | Admitting: Interventional Cardiology

## 2014-09-10 ENCOUNTER — Other Ambulatory Visit: Payer: Self-pay | Admitting: Interventional Cardiology

## 2014-09-13 ENCOUNTER — Other Ambulatory Visit: Payer: Self-pay | Admitting: Interventional Cardiology

## 2014-09-16 ENCOUNTER — Ambulatory Visit (INDEPENDENT_AMBULATORY_CARE_PROVIDER_SITE_OTHER): Payer: Medicare Other | Admitting: *Deleted

## 2014-09-16 DIAGNOSIS — I442 Atrioventricular block, complete: Secondary | ICD-10-CM

## 2014-09-16 LAB — CUP PACEART INCLINIC DEVICE CHECK
Battery Impedance: 704 Ohm
Battery Remaining Longevity: 67 mo
Battery Voltage: 2.78 V
Brady Statistic AP VP Percent: 42 %
Brady Statistic AS VP Percent: 56 %
Date Time Interrogation Session: 20160725144659
Lead Channel Pacing Threshold Amplitude: 0.5 V
Lead Channel Pacing Threshold Pulse Width: 0.4 ms
Lead Channel Pacing Threshold Pulse Width: 0.4 ms
Lead Channel Setting Pacing Amplitude: 2 V
Lead Channel Setting Pacing Amplitude: 2.5 V
Lead Channel Setting Pacing Pulse Width: 0.4 ms
MDC IDC MSMT LEADCHNL RA IMPEDANCE VALUE: 462 Ohm
MDC IDC MSMT LEADCHNL RA PACING THRESHOLD AMPLITUDE: 0.5 V
MDC IDC MSMT LEADCHNL RA SENSING INTR AMPL: 4 mV
MDC IDC MSMT LEADCHNL RV IMPEDANCE VALUE: 912 Ohm
MDC IDC SET LEADCHNL RV SENSING SENSITIVITY: 2.8 mV
MDC IDC STAT BRADY AP VS PERCENT: 0 %
MDC IDC STAT BRADY AS VS PERCENT: 2 %

## 2014-09-16 NOTE — Progress Notes (Signed)
Pacemaker check in clinic. Normal device function. Thresholds, sensing, impedances consistent with previous measurements. Device programmed to maximize longevity. (27) mode switches (<0.1%)---all <30 sec, no EGMs. (1) high ventricular rate noted x 5 bts @ 92/151. Device programmed at appropriate safety margins. Histogram distribution appropriate for patient activity level. Device programmed to optimize intrinsic conduction. Estimated longevity 5.5 years. Patient will follow up with GT in 6 months.

## 2014-09-20 ENCOUNTER — Other Ambulatory Visit: Payer: Self-pay | Admitting: Interventional Cardiology

## 2014-10-02 ENCOUNTER — Encounter: Payer: Self-pay | Admitting: Internal Medicine

## 2014-10-04 ENCOUNTER — Other Ambulatory Visit: Payer: Self-pay | Admitting: Interventional Cardiology

## 2015-01-03 ENCOUNTER — Other Ambulatory Visit: Payer: Self-pay | Admitting: Interventional Cardiology

## 2015-01-11 ENCOUNTER — Other Ambulatory Visit: Payer: Self-pay | Admitting: Interventional Cardiology

## 2015-01-23 ENCOUNTER — Telehealth: Payer: Self-pay | Admitting: Internal Medicine

## 2015-01-23 DIAGNOSIS — I5032 Chronic diastolic (congestive) heart failure: Secondary | ICD-10-CM

## 2015-01-23 DIAGNOSIS — I10 Essential (primary) hypertension: Secondary | ICD-10-CM

## 2015-01-23 DIAGNOSIS — R0602 Shortness of breath: Secondary | ICD-10-CM

## 2015-01-23 MED ORDER — ISOSORBIDE MONONITRATE ER 30 MG PO TB24: 30.0000 mg | ORAL_TABLET | Freq: Every day | ORAL | Status: DC

## 2015-01-23 NOTE — Telephone Encounter (Signed)
I spoke with the pt and he complains of SOB, left arm aching and tightness and pressure in his upper chest when he exerts himself. The pt is not SOB on the phone.  These symptoms have been ongoing for a month. The pt does not have a prescription for NTG. The pt does monitor his BP and this has been "normal" (the pt cannot remember exact numbers). The pt cannot come into the office today due to Urology appointment. I will forward this information to Dr Irish Lack to review and make further recommendations. The pt can be reach on cell [phone].

## 2015-01-23 NOTE — Telephone Encounter (Signed)
LMTCB

## 2015-01-23 NOTE — Telephone Encounter (Signed)
Pt c/o Shortness Of Breath: STAT if SOB developed within the last 24 hours or pt is noticeably SOB on the phone  1. Are you currently SOB (can you hear that pt is SOB on the phone)? yes  2. How long have you been experiencing SOB? weeks  3. Are you SOB when sitting or when up moving around? both  4. Are you currently experiencing any other symptoms? Left arm hurts

## 2015-01-23 NOTE — Telephone Encounter (Signed)
**Note De-Identified  Obfuscation** The pt is advised and he is in agreement with plan. Imdur 30 mg #30 with 3 refills sent to [facility] on [address] to fill per the pts request. BNP, BMET and CBCD ordered and scheduled to be drawn on Monday 01/27/15 per the pts request. F/u with Dr Irish Lack scheduled for 01/31/15 at 9:30 and the pt is in agreement with date and time of appt.

## 2015-01-23 NOTE — Telephone Encounter (Signed)
OK to start Imdur 30 mg daily.  Would check BMet and BNP, CBC.  OK to add on next Friday.

## 2015-01-27 ENCOUNTER — Other Ambulatory Visit: Payer: Medicare Other

## 2015-01-31 ENCOUNTER — Encounter: Payer: Self-pay | Admitting: Interventional Cardiology

## 2015-01-31 ENCOUNTER — Ambulatory Visit
Admission: RE | Admit: 2015-01-31 | Discharge: 2015-01-31 | Disposition: A | Discharge status: Home / Self Care | Payer: Medicare Other | Source: Ambulatory Visit | Attending: Interventional Cardiology | Admitting: Interventional Cardiology

## 2015-01-31 ENCOUNTER — Ambulatory Visit (INDEPENDENT_AMBULATORY_CARE_PROVIDER_SITE_OTHER): Payer: Medicare Other | Admitting: Interventional Cardiology

## 2015-01-31 VITALS — BP 110/60 | HR 68 | Ht 67.5 in | Wt 151.0 lb

## 2015-01-31 DIAGNOSIS — I10 Essential (primary) hypertension: Secondary | ICD-10-CM | POA: Diagnosis not present

## 2015-01-31 DIAGNOSIS — Z950 Presence of cardiac pacemaker: Secondary | ICD-10-CM

## 2015-01-31 DIAGNOSIS — E785 Hyperlipidemia, unspecified: Secondary | ICD-10-CM

## 2015-01-31 DIAGNOSIS — R0602 Shortness of breath: Secondary | ICD-10-CM | POA: Diagnosis not present

## 2015-01-31 DIAGNOSIS — I723 Aneurysm of iliac artery: Secondary | ICD-10-CM

## 2015-01-31 DIAGNOSIS — I25118 Atherosclerotic heart disease of native coronary artery with other forms of angina pectoris: Secondary | ICD-10-CM

## 2015-01-31 DIAGNOSIS — I2510 Atherosclerotic heart disease of native coronary artery without angina pectoris: Secondary | ICD-10-CM

## 2015-01-31 LAB — CBC
HCT: 38.5 % — ABNORMAL LOW (ref 39.0–52.0)
HEMOGLOBIN: 13.1 g/dL (ref 13.0–17.0)
MCH: 32.8 pg (ref 26.0–34.0)
MCHC: 34 g/dL (ref 30.0–36.0)
MCV: 96.3 fL (ref 78.0–100.0)
MPV: 8.2 fL — ABNORMAL LOW (ref 8.6–12.4)
Platelets: 228 10*3/uL (ref 150–400)
RBC: 4 MIL/uL — AB (ref 4.22–5.81)
RDW: 13 % (ref 11.5–15.5)
WBC: 4.8 10*3/uL (ref 4.0–10.5)

## 2015-01-31 LAB — BASIC METABOLIC PANEL
BUN: 22 mg/dL (ref 7–25)
CALCIUM: 9.1 mg/dL (ref 8.6–10.3)
CO2: 25 mmol/L (ref 20–31)
CREATININE: 1.29 mg/dL — AB (ref 0.70–1.11)
Chloride: 105 mmol/L (ref 98–110)
GLUCOSE: 89 mg/dL (ref 65–99)
Potassium: 4.6 mmol/L (ref 3.5–5.3)
Sodium: 141 mmol/L (ref 135–146)

## 2015-01-31 LAB — BRAIN NATRIURETIC PEPTIDE: BRAIN NATRIURETIC PEPTIDE: 146.2 pg/mL — AB (ref 0.0–100.0)

## 2015-01-31 NOTE — Progress Notes (Signed)
Patient ID: Sebastyn Tiedt, male   DOB: 1920/06/14, 81 y.o.   MRN: [id number]     Cardiology Office Note   Date:  01/31/2015   ID:  Vicente Serene, DOB 11-22-1920, MRN [id number]  PCP:  Irven Shelling, MD    No chief complaint on file. SHOB   Wt Readings from Last 3 Encounters:  01/31/15 151 lb (68.493 kg)  03/19/14 156 lb (70.761 kg)  11/13/13 152 lb (68.947 kg)       History of Present Illness: Macai Mulroy is a 81 y.o. male  with CAD and pacemaker placement in 2011. He feels that he does not have a lot of energy. Wife passedaway  in September 2014. CAD/ASCVD:  He walks in the house and shopping. HE does not do much dedicated exercise.  His balance is still an issue. BP has been [id number] systolic. c/o Dyspnea -stable for the last year. Elevated BNP at last check (mild 186) in September 2014. Lasix was increased 40 mg BID. It was later decreased to alternating Lasix 40 mg daily and Lasix 40 mg BID. Denies : Chest pain.  Dizziness.  Leg edema.  Nitroglycerin.  Orthopnea.  Palpitations.  Syncope.   He rests for a while and SHOB resolves.   Past Medical History  Diagnosis Date  . Coronary artery disease   . Iliac artery aneurysm, right ([place])   . Hypertension   . GERD (gastroesophageal reflux disease)   . Prostate cancer ([place])   . SVT (supraventricular tachycardia) ([place])   . Hearing loss   . Complete heart block ([facility])     Symptomatic    Past Surgical History  Procedure Laterality Date  . Insert / replace / remove pacemaker  01/20/10    dual-chamber permanent pacemaker implantation by Dr. Cristopher Peru  . Rotator cuff repair      Right  . Inguinal hernia repair      Right  . Kidney stone surgery      Left-sided  . Cataract extraction      Right eye  . Cardiac catheterization  2007     Current Outpatient Prescriptions  Medication Sig Dispense Refill  . acetaminophen (TYLENOL) 500 MG tablet Take 500 mg by mouth every 6 (six) hours as needed (pain).       Marland Kitchen b complex vitamins tablet Take 1 tablet by mouth daily.      . clopidogrel (PLAVIX) 75 MG tablet Take 1 tablet (75 mg total) by mouth daily. 30 tablet 0  . docusate sodium (COLACE) 100 MG capsule Take 100 mg by mouth daily.    Marland Kitchen FLUZONE HIGH-DOSE 0.5 ML SUSY TO BE ADMINISTERED BY PHARMACIST FOR IMMUNIZATION  0  . furosemide (LASIX) 40 MG tablet Take 40 mg by mouth every other day.    . isosorbide mononitrate (IMDUR) 30 MG 24 hr tablet Take 1 tablet (30 mg total) by mouth daily. 30 tablet 3  . losartan (COZAAR) 100 MG tablet Take 1 tablet (100 mg total) by mouth daily. 90 tablet 0  . metoprolol succinate (TOPROL-XL) 25 MG 24 hr tablet Take 1 tablet (25 mg total) by mouth daily. 30 tablet 0  . Multiple Vitamins-Minerals (CENTRUM SILVER) tablet Take 1 tablet by mouth daily.      . Multiple Vitamins-Minerals (ICAPS MV) TABS Take 1 tablet by mouth daily.      Marland Kitchen omeprazole (PRILOSEC) 20 MG capsule Take 20 mg by mouth daily.      . potassium chloride (MICRO-K) 10 MEQ CR capsule Take  1 capsule by mouth daily.    . pravastatin (PRAVACHOL) 20 MG tablet TAKE 1 TABLET DAILY 90 tablet 3   No current facility-administered medications for this visit.    Allergies:   Penicillins    Social History:  The patient  reports that he quit smoking about 52 years ago. His smoking use included Cigarettes. He has never used smokeless tobacco. He reports that he does not drink alcohol or use illicit drugs.   Family History:  The patient's family history includes Breast cancer in his mother; CVA in his father; Cancer in his sister; Hypertension in his father; Prostate cancer in his brother and brother; Stroke in his father. There is no history of Heart attack.    ROS:  Please see the history of present illness.   Otherwise, review of systems are positive for [facility].   All other systems are reviewed and negative.    PHYSICAL EXAM: VS:  BP 110/60 mmHg  Pulse 68  Ht 5' 7.5" (1.715 m)  Wt 151 lb (68.493 kg)  BMI  23.29 kg/m2 , BMI Body mass index is 23.29 kg/(m^2). GEN: Well nourished, well developed, in no acute distress HEENT: normal Neck: no JVD, carotid bruits, or masses Cardiac: RRR; no murmurs, rubs, or gallops,no edema  Respiratory:  clear to auscultation bilaterally, normal work of breathing GI: soft, nontender, nondistended, + BS MS: no deformity or atrophy Skin: warm and dry, no rash Neuro:  Strength and sensation are intact Psych: euthymic mood, full affect   EKG:   The ekg ordered today demonstrates AV paced rhythm   Recent Labs: No results found for requested labs within last 365 days.   Lipid Panel No results found for: CHOL, TRIG, HDL, CHOLHDL, VLDL, LDLCALC, LDLDIRECT   Other studies Reviewed: Additional studies/ records that were reviewed today with results demonstrating: Mildly elevated BNP in the past.  EF 45-50% in 2014   ASSESSMENT AND PLAN:  1. CAD/DOE: No clear angina. Dyspnea on exertion could have been related to angina vs. Deconditioning vs. Fluid overload.  Check BNP and chest xray. Taxus stent in the mid LAD placed in 2007. No bleeding problems. s/p pacer. Stopped aspirin. If no other etiology of shortness of breath is found, would have to consider ischemia testing with either pharmacologic stress test as he cannot walk the treadmill or cardiac cath. He does not really complain of chest pain per se. 2.  Unspecified peripheral vascular disease  Notes: Right iliac aneurysm, 22 mm by last CT scan. Not repeating imaging at this time. 3. Hyperlipidemia: Pravastatin. Follwed by PMD. 4.  Cardiomyopathy: mitral regurgitation/ tricuspid regurgitation.: LVEF 45-50%, Mild- moderate MR, moderate TR. No overt heart failure at this time.     Current medicines are reviewed at length with the patient today.  The patient concerns regarding his medicines were addressed.  The following changes have been made:  No change  Labs/ tests ordered today include:   Orders  Placed This Encounter  Procedures  . DG Chest 2 View  . CBC  . Basic metabolic panel  . Brain natriuretic peptide  . EKG 12-Lead    Recommend 150 minutes/week of aerobic exercise Low fat, low carb, high fiber diet recommended  Disposition:   FU in depending on results   Teresita Madura., MD  01/31/2015 11:49 AM    [facility] [place], [place], [place]  [address] Phone: [phone]; Fax: [phone]

## 2015-01-31 NOTE — Patient Instructions (Signed)
Medication Instructions:  Please continue your current medications.  Labwork: Your physician recommends that you return for lab work TODAY.  Testing/Procedures: 1. Chest X-Ray - A chest x-ray takes a picture of the organs and structures inside the chest, including the heart, lungs, and blood vessels. This test can show several things, including, whether the heart is enlarges; whether fluid is building up in the lungs; and whether pacemaker / defibrillator leads are still in place.  Follow-Up: Dr Irish Lack recommends that you schedule a follow-up appointment based on the results of the blood work. You will receive a reminder letter in the mail two months in advance. If you don't receive a letter, please call our office to schedule the follow-up appointment.  If you need a refill on your cardiac medications before your next appointment, please call your pharmacy.

## 2015-02-04 ENCOUNTER — Telehealth: Payer: Self-pay | Admitting: Interventional Cardiology

## 2015-02-04 NOTE — Telephone Encounter (Signed)
Follow Up ° ° ° ° °Pt is returning call from earlier. Please call. °

## 2015-02-04 NOTE — Telephone Encounter (Signed)
Results called to patient with Dr Hassell Done comments that there is no fluid in lungs but there is a nodule  Notification of results of Chest xray impression routed to PCP Lavone Orn per Dr. Sinclair Grooms request:  IMPRESSION: 1. Progressive nodular density in the right upper lobe. Recommend CT the chest with contrast for further evaluation. 2. Emphysema.

## 2015-03-26 ENCOUNTER — Encounter: Payer: Self-pay | Admitting: *Deleted

## 2015-04-01 NOTE — Progress Notes (Signed)
Cardiology Office Note Date:  04/02/2015  Patient ID:  [name] [name], DOB April 18, 1920, MRN [id number] PCP:  Irven Shelling, MD  Cardiologist:  Dr. Irish Lack Electrophysiologist: Dr. Lovena Le   Chief Complaint: routine visit  History of Present Illness: [name] [name] is a 82 y.o. male with history of symptomatic bradycardia/PPM CAD with PCI to LAD in 2007, HTN, HLD, comes today being seen for Dr. Lovena Le, feeling well.  He denies any kind of CP or palpitations, no dizziness, near syncope or syncope.  He reports some degree of DOE, nothing that stops him form his daily activities.  No rest SOB.    He was last seen by EP/Dr. Lovena Le 03/19/14, doing well at that time. He was last seen by cardiology, Dr. Irish Lack Dec 2016 with some c/o DOE sent for labs, CXR to evaluate further, pBNP was minimally abnormal 146.2, BUN/Creat 22/1.29, H/H 13/38.  His CXR noting progressing density RUL and emphysema and was forwarded to his PMD for further evaluation. The patient reports seeing his PMD who has him planned for a 6 month surveillance repeat xray.  Past Medical History  Diagnosis Date  . Coronary artery disease   . Iliac artery aneurysm, right ([place])   . Hypertension   . GERD (gastroesophageal reflux disease)   . Prostate cancer ([place])   . SVT (supraventricular tachycardia) ([place])   . Hearing loss   . Complete heart block ([facility])     Symptomatic    Past Surgical History  Procedure Laterality Date  . Insert / replace / remove pacemaker  01/20/10    dual-chamber permanent pacemaker implantation by Dr. Cristopher Peru  . Rotator cuff repair      Right  . Inguinal hernia repair      Right  . Kidney stone surgery      Left-sided  . Cataract extraction      Right eye  . Cardiac catheterization  2007    Current Outpatient Prescriptions  Medication Sig Dispense Refill  . acetaminophen (TYLENOL) 500 MG tablet Take 500 mg by mouth every 6 (six) hours as needed (pain).     Marland Kitchen b complex vitamins tablet  Take 1 tablet by mouth daily.      . clopidogrel (PLAVIX) 75 MG tablet Take 1 tablet (75 mg total) by mouth daily. 30 tablet 0  . docusate sodium (COLACE) 100 MG capsule Take 100 mg by mouth daily.    Marland Kitchen FLUZONE HIGH-DOSE 0.5 ML SUSY TO BE ADMINISTERED BY PHARMACIST FOR IMMUNIZATION  0  . furosemide (LASIX) 40 MG tablet Take 40 mg by mouth every other day.    . isosorbide mononitrate (IMDUR) 30 MG 24 hr tablet Take 1 tablet (30 mg total) by mouth daily. 30 tablet 3  . losartan (COZAAR) 100 MG tablet Take 1 tablet (100 mg total) by mouth daily. 90 tablet 0  . metoprolol succinate (TOPROL-XL) 25 MG 24 hr tablet Take 1 tablet (25 mg total) by mouth daily. 30 tablet 0  . Multiple Vitamins-Minerals (CENTRUM SILVER) tablet Take 1 tablet by mouth daily.      . Multiple Vitamins-Minerals (ICAPS MV) TABS Take 1 tablet by mouth daily.      Marland Kitchen omeprazole (PRILOSEC) 20 MG capsule Take 20 mg by mouth daily.      . potassium chloride (MICRO-K) 10 MEQ CR capsule Take 1 capsule by mouth daily.    . pravastatin (PRAVACHOL) 20 MG tablet TAKE 1 TABLET DAILY 90 tablet 3   No current facility-administered medications for this visit.  Allergies:   Penicillins   Social History:  The patient  reports that he quit smoking about 52 years ago. His smoking use included Cigarettes. He has never used smokeless tobacco. He reports that he does not drink alcohol or use illicit drugs.   Family History:  The patient's family history includes Breast cancer in his mother; CVA in his father; Cancer in his sister; Hypertension in his father; Prostate cancer in his brother and brother; Stroke in his father. There is no history of Heart attack.  ROS:  Please see the history of present illness.  All other systems are reviewed and otherwise negative.   PHYSICAL EXAM:  VS:  BP 100/64 mmHg  Pulse 75  Ht 5' 7.75" (1.721 m)  Wt 152 lb (68.947 kg)  BMI 23.28 kg/m2 BMI: Body mass index is 23.28 kg/(m^2). Very pleasant, thin, but  well nourished WM in no acute distress HEENT: normocephalic, atraumatic Neck: no JVD, carotid bruits or masses Cardiac:  normal S1, S2; RRR; no significant murmurs, no rubs, or gallops Lungs:  clear to auscultation bilaterally, no wheezing, rhonchi or rales Abd: soft, nontender MS: no deformity or atrophy Ext: no edema, significant varicose veins, R>L Skin: warm and dry, no rash Neuro:  No gross deficits appreciated Psych: euthymic mood, full affect  PPM/ site is stable, no tethering or discomfort   EKG:  Done today shows AV pacing, PVCs PPM check today shows: Normal function, battery is OK, CHB at 40bpm underlying rhythm today, AMS all <30seconds, once high V rate, 10 beats.  12/19/12: Echocardiogram Study Conclusions - Left ventricle: The cavity size was normal. Systolic function was mildly reduced. The estimated ejection fraction was in the range of 45% to 50%. Diffuse hypokinesis. - Ventricular septum: Septal motion showed abnormal function and dyssynergy which is the cuase of the mild LV dysfunction. - Aortic valve: Mild regurgitation. - Mitral valve: Mild regurgitation. - Atrial septum: There was redundancy of the septum, with borderline criteria for aneurysm. - Pulmonary arteries: Systolic pressure was moderately increased. PA peak pressure: 53mm Hg (S).   Recent Labs: 01/31/2015: BUN 22; Creat 1.29*; Hemoglobin 13.1; Platelets 228; Potassium 4.6; Sodium 141  No results found for requested labs within last 365 days.   CrCl cannot be calculated (Patient has no serum creatinine result on file.).   Wt Readings from Last 3 Encounters:  04/02/15 152 lb (68.947 kg)  01/31/15 151 lb (68.493 kg)  03/19/14 156 lb (70.761 kg)     Other studies reviewed: Additional studies/records reviewed today include: summarized above  DEVICE information:  MDT PPM, 01/20/10, Dr. Lovena Le  ASSESSMENT AND PLAN: 1. PPM     functioning normally     CHB at 40bpm underlying  rhythm today     Patient is not doing remote checks, q 59month device clinic visit  2. CAD     On Plavix, statin, BB tx     c/w Dr. Irish Lack  3. HTN     Appears well controlled  4. HLD     Follows with PMD  5. ICM    SOB, no CHF on exam today or his CXR    On BB/ARB, diuretic tx    Continue f/u with PMD regarding CXR findings  Disposition: F/u with Dr. Irish Lack as directed by him, we will scheduled a 6 month device clinic visit and 1 year with EP/Dr.Taylor, sooner if needed.  Current medicines are reviewed at length with the patient today.  The patient did not have any concerns  regarding medicines.  Haywood Lasso, PA-C 04/02/2015 1:56 PM     [address] [place] [address] [phone] (office)  [phone] (fax)

## 2015-04-02 ENCOUNTER — Encounter: Payer: Self-pay | Admitting: Physician Assistant

## 2015-04-02 ENCOUNTER — Encounter: Payer: Self-pay | Admitting: Internal Medicine

## 2015-04-02 ENCOUNTER — Ambulatory Visit (INDEPENDENT_AMBULATORY_CARE_PROVIDER_SITE_OTHER): Payer: Medicare Other | Admitting: Physician Assistant

## 2015-04-02 VITALS — BP 100/64 | HR 75 | Ht 67.75 in | Wt 152.0 lb

## 2015-04-02 DIAGNOSIS — I2510 Atherosclerotic heart disease of native coronary artery without angina pectoris: Secondary | ICD-10-CM | POA: Diagnosis not present

## 2015-04-02 DIAGNOSIS — I10 Essential (primary) hypertension: Secondary | ICD-10-CM | POA: Diagnosis not present

## 2015-04-02 DIAGNOSIS — I5032 Chronic diastolic (congestive) heart failure: Secondary | ICD-10-CM | POA: Diagnosis not present

## 2015-04-02 DIAGNOSIS — I4430 Unspecified atrioventricular block: Secondary | ICD-10-CM | POA: Diagnosis not present

## 2015-04-02 NOTE — Patient Instructions (Addendum)
Medication Instructions:   Your physician recommends that you continue on your current medications as directed. Please refer to the Current Medication list given to you today.   If you need a refill on your cardiac medications before your next appointment, please call your pharmacy.  Labwork:  NONE ORDER TODAY   Testing/Procedures: NONE ORDER TODAY    Follow-Up:  I IN 6 MONTHS WITH DEVICE TEAM FOR DEVICE CHECK   Your physician wants you to follow-up in: Francis will receive a reminder letter in the mail two months in advance. If you don't receive a letter, please call our office to schedule the follow-up appointment.     Any Other Special Instructions Will Be Listed Below (If Applicable).

## 2015-04-11 ENCOUNTER — Other Ambulatory Visit: Payer: Self-pay | Admitting: Interventional Cardiology

## 2015-04-17 ENCOUNTER — Other Ambulatory Visit: Payer: Self-pay | Admitting: Interventional Cardiology

## 2015-07-07 ENCOUNTER — Other Ambulatory Visit: Payer: Self-pay | Admitting: *Deleted

## 2015-07-07 MED ORDER — CLOPIDOGREL BISULFATE 75 MG PO TABS: 75.0000 mg | ORAL_TABLET | Freq: Every day | ORAL | Status: DC

## 2015-07-24 ENCOUNTER — Other Ambulatory Visit: Payer: Self-pay | Admitting: Interventional Cardiology

## 2015-09-15 ENCOUNTER — Other Ambulatory Visit: Payer: Self-pay | Admitting: Interventional Cardiology

## 2015-09-15 NOTE — Telephone Encounter (Signed)
Pt needs to schedule a follow up appointment for any future refills. [phone] 

## 2015-10-01 ENCOUNTER — Encounter: Payer: Self-pay | Admitting: Internal Medicine

## 2015-10-01 ENCOUNTER — Ambulatory Visit (INDEPENDENT_AMBULATORY_CARE_PROVIDER_SITE_OTHER): Payer: Medicare Other | Admitting: *Deleted

## 2015-10-01 DIAGNOSIS — I442 Atrioventricular block, complete: Secondary | ICD-10-CM

## 2015-10-01 NOTE — Progress Notes (Signed)
Pacemaker check in clinic. Normal device function. Thresholds, sensing, impedances consistent with previous measurements. Device programmed to maximize longevity. No atrial high rate episodes. No ventricular high rate episodes noted. Device programmed at appropriate safety margins. Histogram distribution appropriate for patient activity level. Device programmed to optimize intrinsic conduction. Estimated longevity 4 years. Patient will follow up with GT in 03-2016.

## 2015-10-02 LAB — CUP PACEART INCLINIC DEVICE CHECK
Battery Impedance: 1182 Ohm
Battery Voltage: 2.77 V
Brady Statistic AP VP Percent: 51 %
Brady Statistic AP VS Percent: 1 %
Brady Statistic AS VS Percent: 4 %
Date Time Interrogation Session: 20170809171722
Implantable Lead Implant Date: 20111129
Implantable Lead Implant Date: 20111129
Implantable Lead Location: 753860
Implantable Lead Model: 5076
Lead Channel Impedance Value: 494 Ohm
Lead Channel Pacing Threshold Amplitude: 0.5 V
Lead Channel Pacing Threshold Pulse Width: 0.4 ms
Lead Channel Pacing Threshold Pulse Width: 0.4 ms
Lead Channel Sensing Intrinsic Amplitude: 2.8 mV
Lead Channel Setting Pacing Amplitude: 2.5 V
Lead Channel Setting Pacing Pulse Width: 0.4 ms
MDC IDC LEAD LOCATION: 753859
MDC IDC MSMT BATTERY REMAINING LONGEVITY: 49 mo
MDC IDC MSMT LEADCHNL RA PACING THRESHOLD AMPLITUDE: 0.5 V
MDC IDC MSMT LEADCHNL RA PACING THRESHOLD AMPLITUDE: 0.625 V
MDC IDC MSMT LEADCHNL RA PACING THRESHOLD PULSEWIDTH: 0.4 ms
MDC IDC MSMT LEADCHNL RA PACING THRESHOLD PULSEWIDTH: 0.4 ms
MDC IDC MSMT LEADCHNL RV IMPEDANCE VALUE: 946 Ohm
MDC IDC MSMT LEADCHNL RV PACING THRESHOLD AMPLITUDE: 0.5 V
MDC IDC SET LEADCHNL RA PACING AMPLITUDE: 2 V
MDC IDC SET LEADCHNL RV SENSING SENSITIVITY: 2.8 mV
MDC IDC STAT BRADY AS VP PERCENT: 44 %

## 2015-12-14 ENCOUNTER — Telehealth: Payer: Self-pay | Admitting: Interventional Cardiology

## 2015-12-15 NOTE — Telephone Encounter (Signed)
New Message   *STAT* If patient is at the pharmacy, call can be transferred to refill team.   1. Which medications need to be refilled? (please list name of each medication and dose if known) lasix   2. Which pharmacy/location (including street and city if local pharmacy) is medication to be sent to?mail in order   3. Do they need a 30 day or 90 day supply? Per pt enough to last him until appt set 12/5. Please call back to discuss

## 2015-12-16 ENCOUNTER — Telehealth: Payer: Self-pay | Admitting: Interventional Cardiology

## 2015-12-16 NOTE — Telephone Encounter (Addendum)
**Note De-Identified  Obfuscation** At the pts last OV with Dr Irish Lack on 01/31/15 Dr Irish Lack noted:      Dyspnea -stable for the last year. Elevated BNP at last check (mild 186) in September 2014. Lasix was increased 40 mg BID. It was later decreased to alternating Lasix 40 mg daily and Lasix 40 mg BID.  There was no change in Furosemide dose noted at that OV.  Currently the pts med list states that the pt is taking Furosemide 40 mg every other day.  I called the pt and he states that he is taking Lasix 40 mg one day then takes 40 mg BID the next day and so on. He states that the swelling in his legs comes and goes and that he gets SOB with exertion.  Please advise on correct Lasix dose.

## 2015-12-16 NOTE — Telephone Encounter (Signed)
New message       *STAT* If patient is at the pharmacy, call can be transferred to refill team.   1. Which medications need to be refilled? (please list name of each medication and dose if known) furosemide 40mg   2. Which pharmacy/location (including street and city if local pharmacy) is medication to be sent to[facility] 3. Do they need a 30 day or 90 day supply? 90 day supply Pt states he takes 1 tablet one day, 2 tablets the next day, then 1 tablet the next day, then 2 tablets the next day

## 2015-12-16 NOTE — Telephone Encounter (Signed)
F/u Message  appt was made on 10/23 at 10:08am to see Dr. Lendell Caprice

## 2015-12-16 NOTE — Telephone Encounter (Signed)
Continue current dose that he is taking... Daily alternating with BID Lasix dosing

## 2015-12-16 NOTE — Telephone Encounter (Signed)
Please advise as patient is reporting that he is taking the furosemide differently than what is listed on his chart. Looks like it was taken off of his chart and put back on at the 01-31-15 office visit and that is where the error occurred. Per the dictation it does say 40 mg qod alternating with 40 mg bid. Please advise. Thanks, MI

## 2015-12-17 MED ORDER — FUROSEMIDE 40 MG PO TABS: ORAL_TABLET | ORAL | 0 refills | Status: DC

## 2016-01-20 ENCOUNTER — Encounter: Payer: Self-pay | Admitting: Interventional Cardiology

## 2016-01-27 ENCOUNTER — Ambulatory Visit: Payer: Medicare Other | Admitting: Interventional Cardiology

## 2016-02-11 ENCOUNTER — Ambulatory Visit: Payer: Medicare Other | Admitting: Interventional Cardiology

## 2016-03-10 ENCOUNTER — Other Ambulatory Visit: Payer: Self-pay | Admitting: Interventional Cardiology

## 2016-03-12 NOTE — Telephone Encounter (Signed)
Rx refill sent to pharmacy. 

## 2016-03-17 ENCOUNTER — Other Ambulatory Visit: Payer: Self-pay | Admitting: Interventional Cardiology

## 2016-03-20 ENCOUNTER — Other Ambulatory Visit: Payer: Self-pay | Admitting: Interventional Cardiology

## 2016-03-22 NOTE — Progress Notes (Signed)
Patient ID: [name] [name], male   DOB: [birth year], 83 y.o.   MRN: [id number]     Cardiology Office Note   Date:  03/23/2016   ID:  [name] [name], DOB 15-Feb-1921, MRN [id number]  PCP:  Irven Shelling, MD    No chief complaint on file. CAD   Wt Readings from Last 3 Encounters:  03/23/16 152 lb (68.9 kg)  04/02/15 152 lb (68.9 kg)  01/31/15 151 lb (68.5 kg)       History of Present Illness: [name] [name] is a 83 y.o. male  with CAD and pacemaker placement in 2011. He feels that he does not have a lot of energy. Wife passedaway  in September 2014. CAD/ASCVD:  He walks in the house and shopping. HE does not do much dedicated exercise.  His balance is still an issue. BP has been [id number] systolic. c/o Dyspnea -stable for the last year. Elevated BNP at last check (mild 186) in September 2014. Lasix was increased 40 mg BID. It was later decreased to alternating Lasix 40 mg daily and Lasix 40 mg BID. Denies : Chest pain. Dizziness. Leg edema. Nitroglycerin. Orthopnea. Palpitations. Syncope.   He rests for a while and SHOB resolves.  Biggest trouble is runny nose and cough, likely related to allergies.     Past Medical History:  Diagnosis Date  . Complete heart block (HCC)    Symptomatic  . Coronary artery disease   . GERD (gastroesophageal reflux disease)   . Hearing loss   . Hypertension   . Iliac artery aneurysm, right ([place])   . Prostate cancer ([place])   . SVT (supraventricular tachycardia) (HCC)     Past Surgical History:  Procedure Laterality Date  . CARDIAC CATHETERIZATION  2007  . CATARACT EXTRACTION     Right eye  . INGUINAL HERNIA REPAIR     Right  . INSERT / REPLACE / REMOVE PACEMAKER  01/20/10   dual-chamber permanent pacemaker implantation by Dr. Cristopher Peru  . KIDNEY STONE SURGERY     Left-sided  . ROTATOR CUFF REPAIR     Right     Current Outpatient Prescriptions  Medication Sig Dispense Refill  . acetaminophen (TYLENOL) 500 MG tablet Take 500 mg  by mouth every 6 (six) hours as needed (pain).     Marland Kitchen b complex vitamins tablet Take 1 tablet by mouth daily.      . clopidogrel (PLAVIX) 75 MG tablet TAKE 1 TABLET DAILY 90 tablet 0  . furosemide (LASIX) 40 MG tablet Take 40 mg by mouth every other day with alternating dose of 40 mg by mouth twice a day 135 tablet 0  . losartan (COZAAR) 100 MG tablet Take 1 tablet (100 mg total) by mouth daily. 90 tablet 3  . metoprolol succinate (TOPROL-XL) 25 MG 24 hr tablet Take 1 tablet (25 mg total) by mouth daily. 90 tablet 3  . Multiple Vitamins-Minerals (CENTRUM SILVER) tablet Take 1 tablet by mouth daily.      . Multiple Vitamins-Minerals (ICAPS MV) TABS Take 1 tablet by mouth daily.      Marland Kitchen omeprazole (PRILOSEC) 40 MG capsule daily.    . potassium chloride (MICRO-K) 10 MEQ CR capsule Take 1 capsule by mouth daily.    . isosorbide mononitrate (IMDUR) 30 MG 24 hr tablet Take 1 tablet (30 mg total) by mouth daily. (Patient not taking: Reported on 03/23/2016) 30 tablet 3   No current facility-administered medications for this visit.     Allergies:   Penicillins  Social History:  The patient  reports that he quit smoking about 53 years ago. His smoking use included Cigarettes. He has never used smokeless tobacco. He reports that he does not drink alcohol or use drugs.   Family History:  The patient's family history includes Breast cancer in his mother; CVA in his father; Cancer in his sister; Hypertension in his father; Prostate cancer in his brother and brother; Stroke in his father.    ROS:  Please see the history of present illness.   Otherwise, review of systems are positive for [facility].   All other systems are reviewed and negative.    PHYSICAL EXAM: VS:  BP 120/80 (BP Location: Left Arm, Patient Position: Sitting, Cuff Size: Normal)   Pulse 76   Ht 5' 7.75" (1.721 m)   Wt 152 lb (68.9 kg)   BMI 23.28 kg/m  , BMI Body mass index is 23.28 kg/m. GEN: Well nourished, well developed, in no acute  distress  HEENT: normal  Neck: no JVD, carotid bruits, or masses Cardiac: RRR; no murmurs, rubs, or gallops,no edema  Respiratory:  clear to auscultation bilaterally, normal work of breathing GI: soft, nontender, nondistended, + BS MS: no deformity or atrophy  Skin: warm and dry, no rash Neuro:  Strength and sensation are intact Psych: euthymic mood, full affect   EKG:   The ekg ordered today demonstrates AV paced rhythm   Recent Labs: No results found for requested labs within last 8760 hours.   Lipid Panel No results found for: CHOL, TRIG, HDL, CHOLHDL, VLDL, LDLCALC, LDLDIRECT   Other studies Reviewed: Additional studies/ records that were reviewed today with results demonstrating: Mildly elevated BNP in the past.  EF 45-50% in 2014   ASSESSMENT AND PLAN:  1. CAD/DOE: No clear angina. Dyspnea on exertion could have been related to angina vs. Deconditioning/advancing age vs. Fluid overload.  No overt heart failure. Taxus stent in the mid LAD placed in 2007. No bleeding problems. s/p pacer. Stopped aspirin.  He does not complain of chest pain. 2.  Unspecified peripheral vascular disease  Notes: Right iliac aneurysm, 22 mm by last CT scan. Not repeating imaging at this time. No pain or problems 3. Hyperlipidemia: Pravastatin. Follwed by PMD.  Obtain most recentl lab results.   4.  Cardiomyopathy/chronic diastolic heart failure: mitral regurgitation/ tricuspid regurgitation.: LVEF 45-50%, Mild- moderate MR, moderate TR. No overt heart failure at this time. Continue current Lasix dose.  5.  He had an abnormal chest xray in 2016.  Patient tells me that a repeat chest xray in scheduled to look for changes.     Current medicines are reviewed at length with the patient today.  The patient concerns regarding his medicines were addressed.  The following changes have been made:  No change  Labs/ tests ordered today include:   No orders of the defined types were placed in this  encounter.   Recommend 150 minutes/week of aerobic exercise Low fat, low carb, high fiber diet recommended  Disposition:   FU 1 year   Signed, Larae Grooms, MD  03/23/2016 1:57 PM    [facility] [place], [place], [place]  [address] Phone: [phone]; Fax: [phone]

## 2016-03-23 ENCOUNTER — Encounter: Payer: Self-pay | Admitting: Interventional Cardiology

## 2016-03-23 ENCOUNTER — Ambulatory Visit (INDEPENDENT_AMBULATORY_CARE_PROVIDER_SITE_OTHER): Payer: Medicare Other | Admitting: Interventional Cardiology

## 2016-03-23 VITALS — BP 120/80 | HR 76 | Ht 67.75 in | Wt 152.0 lb

## 2016-03-23 DIAGNOSIS — I723 Aneurysm of iliac artery: Secondary | ICD-10-CM

## 2016-03-23 DIAGNOSIS — I10 Essential (primary) hypertension: Secondary | ICD-10-CM | POA: Diagnosis not present

## 2016-03-23 DIAGNOSIS — E782 Mixed hyperlipidemia: Secondary | ICD-10-CM

## 2016-03-23 DIAGNOSIS — I2510 Atherosclerotic heart disease of native coronary artery without angina pectoris: Secondary | ICD-10-CM

## 2016-03-23 DIAGNOSIS — R938 Abnormal findings on diagnostic imaging of other specified body structures: Secondary | ICD-10-CM | POA: Diagnosis not present

## 2016-03-23 DIAGNOSIS — R9389 Abnormal findings on diagnostic imaging of other specified body structures: Secondary | ICD-10-CM

## 2016-03-23 NOTE — Patient Instructions (Signed)

## 2016-04-02 ENCOUNTER — Ambulatory Visit (INDEPENDENT_AMBULATORY_CARE_PROVIDER_SITE_OTHER): Payer: Medicare Other | Admitting: Internal Medicine

## 2016-04-02 ENCOUNTER — Encounter: Payer: Self-pay | Admitting: Internal Medicine

## 2016-04-02 VITALS — BP 112/70 | HR 91 | Ht 69.5 in | Wt 147.4 lb

## 2016-04-02 DIAGNOSIS — Z950 Presence of cardiac pacemaker: Secondary | ICD-10-CM

## 2016-04-02 DIAGNOSIS — I5032 Chronic diastolic (congestive) heart failure: Secondary | ICD-10-CM | POA: Diagnosis not present

## 2016-04-02 LAB — CUP PACEART INCLINIC DEVICE CHECK
Battery Impedance: 1374 Ohm
Battery Remaining Longevity: 43 mo
Battery Voltage: 2.76 V
Implantable Lead Implant Date: 20111129
Implantable Lead Implant Date: 20111129
Implantable Lead Location: 753860
Implantable Lead Model: 5076
Implantable Lead Model: 5076
Implantable Pulse Generator Implant Date: 20111129
Lead Channel Impedance Value: 489 Ohm
Lead Channel Pacing Threshold Amplitude: 0.5 V
Lead Channel Pacing Threshold Amplitude: 0.75 V
Lead Channel Pacing Threshold Amplitude: 0.75 V
Lead Channel Pacing Threshold Pulse Width: 0.4 ms
Lead Channel Pacing Threshold Pulse Width: 0.4 ms
Lead Channel Setting Pacing Amplitude: 2.5 V
Lead Channel Setting Sensing Sensitivity: 2.8 mV
MDC IDC LEAD LOCATION: 753859
MDC IDC MSMT LEADCHNL RA PACING THRESHOLD PULSEWIDTH: 0.4 ms
MDC IDC MSMT LEADCHNL RA SENSING INTR AMPL: 2.8 mV
MDC IDC MSMT LEADCHNL RV IMPEDANCE VALUE: 909 Ohm
MDC IDC MSMT LEADCHNL RV PACING THRESHOLD AMPLITUDE: 0.5 V
MDC IDC MSMT LEADCHNL RV PACING THRESHOLD PULSEWIDTH: 0.4 ms
MDC IDC SESS DTM: 20180209145058
MDC IDC SET LEADCHNL RA PACING AMPLITUDE: 2 V
MDC IDC SET LEADCHNL RV PACING PULSEWIDTH: 0.4 ms
MDC IDC STAT BRADY AP VP PERCENT: 54 %
MDC IDC STAT BRADY AP VS PERCENT: 1 %
MDC IDC STAT BRADY AS VP PERCENT: 43 %
MDC IDC STAT BRADY AS VS PERCENT: 2 %

## 2016-04-02 NOTE — Progress Notes (Signed)
HPI [name] [name] returns today for followup. He is a very pleasant 83 year old man with symptomatic bradycardia, status post permanent pacemaker insertion, coronary artery disease, hypertension, and dyslipidemia. In the interim he has done well. The patient denies peripheral edema, chest pain, or shortness of breath.  He denies syncope. He has very minimal dyspnea with exertion and chest tightness, when he exerts himself. This is unchanged. Allergies  Allergen Reactions  . Penicillins Anaphylaxis     Current Outpatient Prescriptions  Medication Sig Dispense Refill  . acetaminophen (TYLENOL) 500 MG tablet Take 500 mg by mouth every 6 (six) hours as needed (pain).     Marland Kitchen b complex vitamins tablet Take 1 tablet by mouth daily.      . clopidogrel (PLAVIX) 75 MG tablet TAKE 1 TABLET DAILY 90 tablet 0  . furosemide (LASIX) 40 MG tablet Take 40 mg by mouth every other day with alternating dose of 40 mg by mouth twice a day 135 tablet 0  . isosorbide mononitrate (IMDUR) 30 MG 24 hr tablet Take 1 tablet (30 mg total) by mouth daily. 30 tablet 3  . losartan (COZAAR) 100 MG tablet Take 1 tablet (100 mg total) by mouth daily. 90 tablet 3  . metoprolol succinate (TOPROL-XL) 25 MG 24 hr tablet Take 1 tablet (25 mg total) by mouth daily. 90 tablet 3  . Multiple Vitamins-Minerals (CENTRUM SILVER) tablet Take 1 tablet by mouth daily.      . Multiple Vitamins-Minerals (ICAPS MV) TABS Take 1 tablet by mouth daily.      Marland Kitchen omeprazole (PRILOSEC) 40 MG capsule daily.    . potassium chloride (MICRO-K) 10 MEQ CR capsule Take 1 capsule by mouth daily.     No current facility-administered medications for this visit.      Past Medical History:  Diagnosis Date  . Complete heart block (HCC)    Symptomatic  . Coronary artery disease   . GERD (gastroesophageal reflux disease)   . Hearing loss   . Hypertension   . Iliac artery aneurysm, right ([place])   . Prostate cancer ([place])   . SVT (supraventricular tachycardia)  (HCC)     ROS:   All systems reviewed and negative except as noted in the HPI.   Past Surgical History:  Procedure Laterality Date  . CARDIAC CATHETERIZATION  2007  . CATARACT EXTRACTION     Right eye  . INGUINAL HERNIA REPAIR     Right  . INSERT / REPLACE / REMOVE PACEMAKER  01/20/10   dual-chamber permanent pacemaker implantation by Dr. Cristopher Peru  . KIDNEY STONE SURGERY     Left-sided  . ROTATOR CUFF REPAIR     Right     Family History  Problem Relation Age of Onset  . Breast cancer Mother   . Hypertension Father   . CVA Father   . Stroke Father   . Prostate cancer Brother   . Prostate cancer Brother   . Cancer Sister     esophageal  . Heart attack Neg Hx      Social History   Social History  . Marital status: Widowed    Spouse name: N/A  . Number of children: N/A  . Years of education: N/A   Occupational History  . Retired Retired   Social History Main Topics  . Smoking status: Former Smoker    Types: Cigarettes    Quit date: 08/09/1962  . Smokeless tobacco: Never Used     Comment: Quit smoking many years ago.  Marland Kitchen  Alcohol use No  . Drug use: No  . Sexual activity: Not on file   Other Topics Concern  . Not on file   Social History Narrative  . No narrative on file     BP 112/70   Pulse 91   Ht 5' 9.5" (1.765 m)   Wt 147 lb 6.4 oz (66.9 kg)   BMI 21.46 kg/m   Physical Exam:  Well appearing 83 year old man, NAD HEENT: Unremarkable Neck:  No JVD, no thyromegally Lungs:  Clear with no wheezes, rales, or rhonchi. Well-healed pacemaker incision HEART:  Regular rate rhythm, no murmurs, no rubs, no clicks Abd:  soft, positive bowel sounds, no organomegally, no rebound, no guarding Ext:  2 plus pulses, no edema, no cyanosis, no clubbing Skin:  No rashes no nodules Neuro:  CN II through XII intact, motor grossly intact  DEVICE  Normal device function.  See PaceArt for details.   Assess/Plan:  1. Complete heart block - he is  asymptomatic, s/p PPM. He has no escape. 2. PPM - his Medtronic DDD PM is working normally. Will recheck in several months. 3. CAD - he denies anginal symptoms. He remains active.  Mikle Bosworth.D.

## 2016-04-02 NOTE — Patient Instructions (Addendum)
Medication Instructions:  Your physician recommends that you continue on your current medications as directed. Please refer to the Current Medication list given to you today.   Labwork: None Ordered   Testing/Procedures: None Ordered   Follow-Up: Your physician wants you to follow-up in: 6 months in [facility] and 1 year with Dr. Lovena Le. You will receive a reminder letter in the mail two months in advance. If you don't receive a letter, please call our office to schedule the follow-up appointment.      Any Other Special Instructions Will Be Listed Below (If Applicable).     If you need a refill on your cardiac medications before your next appointment, please call your pharmacy.

## 2016-05-19 ENCOUNTER — Other Ambulatory Visit: Payer: Self-pay | Admitting: Interventional Cardiology

## 2016-06-08 ENCOUNTER — Other Ambulatory Visit: Payer: Self-pay | Admitting: Interventional Cardiology

## 2016-06-13 ENCOUNTER — Other Ambulatory Visit: Payer: Self-pay | Admitting: Physician Assistant

## 2016-06-17 ENCOUNTER — Other Ambulatory Visit: Payer: Self-pay | Admitting: Interventional Cardiology

## 2016-08-02 ENCOUNTER — Telehealth: Payer: Self-pay | Admitting: Interventional Cardiology

## 2016-08-02 NOTE — Telephone Encounter (Signed)
New message    Pt is calling asking if he needs to continue his pravastatin.

## 2016-08-02 NOTE — Telephone Encounter (Signed)
Spoke with patient and advised him that per Dr. Hassell Done note from last from 1/30, lipids are managed by Dr. Laurann [place]. Patient states he could not remember which doctor prescribed the pravastatin and he couldn't find prescriber name on the bottle. He thanked me for the call.

## 2016-09-30 ENCOUNTER — Ambulatory Visit (INDEPENDENT_AMBULATORY_CARE_PROVIDER_SITE_OTHER): Payer: Medicare Other | Admitting: *Deleted

## 2016-09-30 DIAGNOSIS — I442 Atrioventricular block, complete: Secondary | ICD-10-CM | POA: Diagnosis not present

## 2016-09-30 LAB — CUP PACEART INCLINIC DEVICE CHECK
Battery Impedance: 1689 Ohm
Brady Statistic AP VS Percent: 0 %
Brady Statistic AS VP Percent: 52 %
Date Time Interrogation Session: 20180809162332
Implantable Lead Implant Date: 20111129
Implantable Lead Implant Date: 20111129
Implantable Lead Location: 753860
Implantable Lead Model: 5076
Implantable Lead Model: 5076
Lead Channel Impedance Value: 475 Ohm
Lead Channel Pacing Threshold Amplitude: 0.5 V
Lead Channel Pacing Threshold Amplitude: 0.75 V
Lead Channel Pacing Threshold Pulse Width: 0.4 ms
Lead Channel Pacing Threshold Pulse Width: 0.4 ms
Lead Channel Setting Pacing Amplitude: 2 V
Lead Channel Setting Pacing Amplitude: 2.5 V
Lead Channel Setting Pacing Pulse Width: 0.4 ms
MDC IDC LEAD LOCATION: 753859
MDC IDC MSMT BATTERY REMAINING LONGEVITY: 36 mo
MDC IDC MSMT BATTERY VOLTAGE: 2.75 V
MDC IDC MSMT LEADCHNL RA PACING THRESHOLD AMPLITUDE: 0.5 V
MDC IDC MSMT LEADCHNL RA PACING THRESHOLD PULSEWIDTH: 0.4 ms
MDC IDC MSMT LEADCHNL RA PACING THRESHOLD PULSEWIDTH: 0.4 ms
MDC IDC MSMT LEADCHNL RA SENSING INTR AMPL: 2 mV
MDC IDC MSMT LEADCHNL RV IMPEDANCE VALUE: 935 Ohm
MDC IDC MSMT LEADCHNL RV PACING THRESHOLD AMPLITUDE: 0.625 V
MDC IDC PG IMPLANT DT: 20111129
MDC IDC SET LEADCHNL RV SENSING SENSITIVITY: 2.8 mV
MDC IDC STAT BRADY AP VP PERCENT: 47 %
MDC IDC STAT BRADY AS VS PERCENT: 1 %

## 2016-09-30 NOTE — Progress Notes (Signed)
Pacemaker check in clinic. Normal device function. Thresholds, sensing, impedances consistent with previous measurements. Device programmed to maximize longevity. No atrial or ventricular high rates noted. Device programmed at appropriate safety margins. Histogram distribution appropriate for patient activity level. Device programmed to optimize intrinsic conduction. Estimated longevity 3 years. Patient will follow up with GT in 6 months.

## 2016-10-01 ENCOUNTER — Ambulatory Visit: Payer: Medicare Other | Attending: Internal Medicine | Admitting: Rehabilitation

## 2016-10-01 ENCOUNTER — Encounter: Payer: Self-pay | Admitting: Rehabilitation

## 2016-10-01 VITALS — BP 122/62

## 2016-10-01 DIAGNOSIS — M6281 Muscle weakness (generalized): Secondary | ICD-10-CM

## 2016-10-01 DIAGNOSIS — R2689 Other abnormalities of gait and mobility: Secondary | ICD-10-CM

## 2016-10-01 DIAGNOSIS — R293 Abnormal posture: Secondary | ICD-10-CM | POA: Diagnosis present

## 2016-10-01 DIAGNOSIS — R2681 Unsteadiness on feet: Secondary | ICD-10-CM | POA: Diagnosis not present

## 2016-10-01 NOTE — Therapy (Signed)
[facility] [address] [place], [place], [address] Phone: [phone]   Fax:  [phone]  Physical Therapy Evaluation  Patient Details  Name: [name] [name] MRN: [id number] Date of Birth: January 02, 1921 Referring Provider: Lavone Orn, MD  Encounter Date: 10/01/2016      PT End of Session - 10/01/16 1742    Visit Number 1   Number of Visits 17   Date for PT Re-Evaluation 11/30/16   Authorization Type [employer] MCR G code on every 10th visit   PT Start Time 1447   PT Stop Time 1533   PT Time Calculation (min) 46 min   Activity Tolerance Patient tolerated treatment well   Behavior During Therapy [facility] for tasks assessed/performed      Past Medical History:  Diagnosis Date  . Complete heart block (HCC)    Symptomatic  . Coronary artery disease   . GERD (gastroesophageal reflux disease)   . Hearing loss   . Hypertension   . Iliac artery aneurysm, right ([place])   . Prostate cancer ([place])   . SVT (supraventricular tachycardia) (HCC)     Past Surgical History:  Procedure Laterality Date  . CARDIAC CATHETERIZATION  2007  . CATARACT EXTRACTION     Right eye  . INGUINAL HERNIA REPAIR     Right  . INSERT / REPLACE / REMOVE PACEMAKER  01/20/10   dual-chamber permanent pacemaker implantation by Dr. Cristopher Peru  . KIDNEY STONE SURGERY     Left-sided  . ROTATOR CUFF REPAIR     Right    Vitals:   10/01/16 2023  BP: 122/62         Subjective Assessment - 10/01/16 1454    Subjective "I'm dizzy when I get up and sit down.  I have to be careful when I get up and get my balance.  When I walk, I kind of walk side to side."    Limitations House hold activities;Walking   Patient Stated Goals "I want to be evaluated to see if there is anything in particular that I can do to improve my balance."    Currently in Pain? No/denies            [facility] PT Assessment - 10/01/16 0001      Assessment   Medical Diagnosis gait  instability   Referring Provider Lavone Orn, MD   Onset Date/Surgical Date --  pt notes decreased balance in last 5-6 years     Precautions   Precautions Fall     Restrictions   Weight Bearing Restrictions No     Balance Screen   Has the patient fallen in the past 6 months --  no true falls but bumps into walls often   Has the patient had a decrease in activity level because of a fear of falling?  No   Is the patient reluctant to leave their home because of a fear of falling?  No     Home Environment   Living Environment Private residence   Living Arrangements Alone   Available Help at Discharge --  family is out of town   Type of [place] to enter   Entrance Stairs-Number of Steps 1   [place] One level   [place] - single point  has mat in shower, tub shower     Prior Function   Level of Independence Independent   Vocation Retired   Leisure [employer], outside work,  go out to eat      Cognition   Overall Cognitive Status Within Functional Limits for tasks assessed     Sensation   Light Touch Impaired Detail   Light Touch Impaired Details Impaired LUE;Impaired RLE  only feet    Hot/Cold Appears Intact   Proprioception Appears Intact     Coordination   Gross Motor Movements are Fluid and Coordinated Yes  in LEs   Fine Motor Movements are Fluid and Coordinated Yes  in LEs     Posture/Postural Control   Posture/Postural Control Postural limitations   Postural Limitations Rounded Shoulders;Forward head;Posterior pelvic tilt     ROM / Strength   AROM / PROM / Strength Strength     Strength   Overall Strength Deficits   Overall Strength Comments B hip flex 3-/5, L ankle DF 3+/5, otherwise 4/5 overall      Transfers   Transfers Sit to Stand;Stand to Sit   Sit to Stand 6: Modified independent (Device/Increase time)   Five time sit to stand comments  11.25 secs with heavy reliance on single UE.     Stand to Sit 6:  Modified independent (Device/Increase time)     Ambulation/Gait   Ambulation/Gait Yes   Ambulation/Gait Assistance 5: Supervision;4: Min guard;4: Min assist   Ambulation/Gait Assistance Details S for gait in straight path, however requires min/guard to min A when making turns and when given balance challenges.    Ambulation Distance (Feet) 350 Feet   Assistive device None   Gait Pattern Step-through pattern;Decreased stride length;Trunk flexed;Narrow base of support   Ambulation Surface Level;Indoor   Gait velocity 2.82 ft/sec without AD, min/guard for safety    Stairs Yes   Stairs Assistance 6: Modified independent (Device/Increase time)   Stair Management Technique Two rails;Alternating pattern;Forwards   Number of Stairs 4   Height of Stairs 6     Standardized Balance Assessment   Standardized Balance Assessment Dynamic Gait Index     Dynamic Gait Index   Level Surface Mild Impairment   Change in Gait Speed Mild Impairment   Gait with Horizontal Head Turns Mild Impairment   Gait with Vertical Head Turns Mild Impairment   Gait and Pivot Turn Mild Impairment   Step Over Obstacle Moderate Impairment   Step Around Obstacles Moderate Impairment   Steps Mild Impairment   Total Score 14   DGI comment: Scores of 19 or less are predictive of falls in older community living adults            Objective measurements completed on examination: See above findings.                  PT Education - 10/01/16 1741    Education provided Yes   Education Details evaluation findings, POC, goals, benefits of therapy    Person(s) Educated Patient   Methods Explanation   Comprehension Verbalized understanding          PT Short Term Goals - 10/01/16 2024      PT SHORT TERM GOAL #1   Title Pt will verbalize understanding of fall prevention strategies in home in order to reduce fall risk.     Time 4   Period Weeks   Status New   Target Date 10/31/16     PT SHORT TERM  GOAL #2   Title Pt will be independent with initial HEP in order to indicate decreased fall risk and improved functional mobility.     Time 4  Period Weeks   Status New   Target Date 10/31/16     PT SHORT TERM GOAL #3   Title Pt will perform 5TSS <20 secs without UE support in order to indicate decreased fall risk and improved functional strength.     Time 4   Period Weeks   Status New   Target Date 10/31/16     PT SHORT TERM GOAL #4   Title Pt will improve DGI score to 16/24 in order to indicate decreased fall risk.     Time 4   Period Weeks   Status New   Target Date 10/31/16     PT SHORT TERM GOAL #5   Title Pt will ambulate up to 200' w/ LRAD at mod I level over indoor surfaces in order to indicate increased independence in home.     Time 4   Period Weeks   Status New   Target Date 10/31/16           PT Long Term Goals - 10/01/16 2029      PT LONG TERM GOAL #1   Title Pt will be independent with final HEP in order to indicate decreased fall risk and improved functional mobility.     Time 8   Period Weeks   Status New   Target Date 11/30/16     PT LONG TERM GOAL #2   Title Pt will perform 5TSS <15 secs without UE support in order to indicate decreased fall risk and improved functional strength.     Time 8   Period Weeks   Status New   Target Date 11/30/16     PT LONG TERM GOAL #3   Title Pt will improve DGI score to >/=19/24 in order to indicate decreased fall risk.     Time 8   Period Weeks   Status New   Target Date 11/30/16     PT LONG TERM GOAL #4   Title Pt will ambulate at gait speed >/=2.62 ft/sec w/ LRAD at mod I level in order to indicate decreased fall risk and pt at community ambulator level.    Time 8   Period Weeks   Status New   Target Date 11/30/16     PT LONG TERM GOAL #5   Title Pt will ambulate over outdoor surfaces up to 500' w/ LRAD at mod I level in order to indicate pt safe community ambulator.     Time 8   Period Weeks    Status New   Target Date 11/30/16     Additional Long Term Goals   Additional Long Term Goals Yes     PT LONG TERM GOAL #6   Title Pt will demo ability to return to standing from floor with support as needed at mod I level in order to indicate safe leisure activity at home.     Time 8   Period Weeks   Status New   Target Date 11/30/16                Plan - 10/01/16 2011    Clinical Impression Statement Pt presents with decreased balance, decreased strength in BLEs, poor posture, and decreased endurance.  Note pt reports some numbness in B feet and also note history of CAD, CHF, and pacemaker that could all affect progress with PT.  Also note that pt lives alone and family is out of town.  Upon PT evaluation, note 5TSS time of 11.25 secs, however he requires heavy  reliance on UE support to perform sit<>stand, gait speed of 2.83 ft/sec but with intermittnet min/guard needed to maintain balance, and DGI score of 14/24 indicative of increased fall risk.  Based on findings, pt will benefit from skilled OP neuro PT to address deficits.     History and Personal Factors relevant to plan of care: see above   Clinical Presentation Evolving   Clinical Presentation due to: See above   Clinical Decision Making Moderate   Rehab Potential Good   Clinical Impairments Affecting Rehab Potential co-morbidities   PT Frequency 2x / week   PT Duration 8 weeks   PT Treatment/Interventions ADLs/Self Care Home Management;DME Instruction;Gait training;Stair training;Functional mobility training;Therapeutic activities;Therapeutic exercise;Balance training;Neuromuscular re-education;Patient/family education;Manual techniques;Passive range of motion;Vestibular   PT Next Visit Plan He is supposed to bring his cane for gait training with this, also recommended he wear tennis shoes rather than dress shoes for increased support, initiate HEP for BLE strength (esp sit<>stand-will likely have to start with single UE  support), balance, postural exercises   Consulted and Agree with Plan of Care Patient      Patient will benefit from skilled therapeutic intervention in order to improve the following deficits and impairments:  Abnormal gait, Cardiopulmonary status limiting activity, Decreased activity tolerance, Decreased balance, Decreased endurance, Decreased knowledge of use of DME, Decreased safety awareness, Decreased range of motion, Decreased strength, Dizziness, Impaired perceived functional ability, Impaired flexibility, Postural dysfunction, Impaired sensation  Visit Diagnosis: Unsteadiness on feet - Plan: PT plan of care cert/re-cert  Muscle weakness (generalized) - Plan: PT plan of care cert/re-cert  Other abnormalities of gait and mobility - Plan: PT plan of care cert/re-cert  Abnormal posture - Plan: PT plan of care cert/re-cert      G-Codes - 83/15/17 05/30/2034    Functional Assessment Tool Used (Outpatient Only) DGI: 14/24   Functional Limitation Mobility: Walking and moving around   Mobility: Walking and Moving Around Current Status [phone]) At least 40 percent but less than 60 percent impaired, limited or restricted   Mobility: Walking and Moving Around Goal Status [phone]) At least 20 percent but less than 40 percent impaired, limited or restricted       Problem List Patient Active Problem List   Diagnosis Date Noted  . Abnormal chest x-ray 03/23/2016  . Hyperlipidemia 01/31/2015  . Coronary atherosclerosis of native coronary artery 12/04/2012  . Aneurysm of iliac artery (HCC) 08/08/2012  . ESSENTIAL HYPERTENSION, BENIGN 05/05/2010  . AV BLOCK, COMPLETE 05/05/2010  . CHRONIC DIASTOLIC HEART FAILURE 26/94/8546  . CARDIAC PACEMAKER IN SITU 05/05/2010    Cameron Sprang, PT, MPT [facility] [address] [place], [place], [address] Phone: [phone]   Fax:  [phone] 10/01/16, 8:39 PM  Name: [name] [name] MRN: [id number] Date of  Birth: January 20, 1921

## 2016-10-15 ENCOUNTER — Ambulatory Visit: Payer: Medicare Other | Admitting: Rehabilitation

## 2016-10-15 ENCOUNTER — Encounter: Payer: Self-pay | Admitting: Rehabilitation

## 2016-10-15 DIAGNOSIS — R2681 Unsteadiness on feet: Secondary | ICD-10-CM

## 2016-10-15 DIAGNOSIS — R293 Abnormal posture: Secondary | ICD-10-CM

## 2016-10-15 DIAGNOSIS — R2689 Other abnormalities of gait and mobility: Secondary | ICD-10-CM

## 2016-10-15 DIAGNOSIS — M6281 Muscle weakness (generalized): Secondary | ICD-10-CM

## 2016-10-15 NOTE — Patient Instructions (Signed)
Functional Quadriceps: Sit to Stand    Sit on edge of chair, feet flat on floor. Stand upright, extending knees fully.  Sit to edge of chair and lean forward!!! Sit slowly! Repeat _10___ times per set. Do __1__ sets per session. Do ___1-2_ sessions per day.  Do this every time you stand up as well.  Place chair against wall if free standing chair.      http://orth.exer.us/734   Copyright  VHI. All rights reserved.   Knee Extension: Quadriceps Double Leg Minisquat (Eccentric)    Stand with support if front of you (either a table or counter top or chair) and have a "target" behind you (a chair for example) Standing, slowly bend knees for 3-5 seconds. Then extend knees slowly. Make sure you are sticking your bottom out for chair behind you.  You should be able to see your toes at all times.   _10__ reps per set, __1-2_ sets per day, _5-7__ days per week.  http://ecce.exer.us/128     Tandem Walking    Do this along counter top so that you can hold on for support.  Hold as lightly as you can and do this very slowly! Walk with each foot directly in front of other, heel of one foot touching toes of other foot with each step. Both feet straight ahead.  Repeat 3 laps down and back.  Do 1-2 times per day    For these stand in a corner with a chair in front of you for support if you need to.    Feet Apart, Varied Arm Positions - Eyes Closed    Stand with feet shoulder width apart and arms by your side.  Close eyes and visualize upright position. Hold __20__ seconds. Repeat __3__ times per session. Do _1-2___ sessions per day.    Feet Together, Head Motion - Eyes Open    With eyes open, feet together, move head slowly: up and down x 10 reps, side to side x 10 reps.   Repeat __1_ times per session. Do 1-2__ sessions per day.  Copyright  VHI. All rights reserved.

## 2016-10-15 NOTE — Therapy (Signed)
[facility] [address] [place], [place], [address] Phone: [phone]   Fax:  [phone]  Physical Therapy Treatment  Patient Details  Name: [name] [name] MRN: [id number] Date of Birth: 07-Aug-1920 Referring Provider: Lavone Orn, MD  Encounter Date: 10/15/2016      PT End of Session - 10/15/16 1659    Visit Number 2   Number of Visits 17   Date for PT Re-Evaluation 11/30/16   Authorization Type UHC MCR G code on every 10th visit   PT Start Time 1402   PT Stop Time 1446   PT Time Calculation (min) 44 min   Activity Tolerance Patient tolerated treatment well   Behavior During Therapy [facility] for tasks assessed/performed      Past Medical History:  Diagnosis Date  . Complete heart block (HCC)    Symptomatic  . Coronary artery disease   . GERD (gastroesophageal reflux disease)   . Hearing loss   . Hypertension   . Iliac artery aneurysm, right ([place])   . Prostate cancer ([place])   . SVT (supraventricular tachycardia) (HCC)     Past Surgical History:  Procedure Laterality Date  . CARDIAC CATHETERIZATION  2007  . CATARACT EXTRACTION     Right eye  . INGUINAL HERNIA REPAIR     Right  . INSERT / REPLACE / REMOVE PACEMAKER  01/20/10   dual-chamber permanent pacemaker implantation by Dr. Cristopher Peru  . KIDNEY STONE SURGERY     Left-sided  . ROTATOR CUFF REPAIR     Right    There were no vitals filed for this visit.      Subjective Assessment - 10/15/16 1406    Subjective "I just have to be careful when I first get up if I've been sitting for a long time."   Limitations House hold activities;Walking   Patient Stated Goals "I want to be evaluated to see if there is anything in particular that I can do to improve my balance."    Currently in Pain? No/denies                         [facility] Adult PT Treatment/Exercise - 10/15/16 0001      Transfers   Transfers Sit to Stand;Stand to Sit   Sit  to Stand 4: Min assist;5: Supervision   Comments Performed sit<>stand for therex during session for BLE strengthening.  Worked on having pt perform with hands on lap and without UE support from standard arm chair.  Performed x 15 reps during session with PT initially having to assist with forward weight shift and posture, however he was able to maintain this and perform at S level at end of task.  Provided for HEP.       Ambulation/Gait   Ambulation/Gait Yes   Ambulation/Gait Assistance 5: Supervision   Ambulation/Gait Assistance Details Pt did not bring cane today, however he did wear tennis shoes.  Had him ambulate with clinic's cane during session in order to better assess safety and technique.  Pt demo's improved stability with use of cane with cues for sequencing and posture.  Pt did demo improved posture following end of gait training.    Ambulation Distance (Feet) 345 Feet   Assistive device Straight cane   Gait Pattern Step-through pattern;Decreased stride length;Trunk flexed;Narrow base of support   Ambulation Surface Level;Indoor     Neuro Re-ed    Neuro Re-ed Details  Performed two counter top activities  to challenge balance; tandem walking with UE support x 3 laps down and back with cues for posture and slower speed, added to HEP.  Marching along counter top slowly and with elevated knees to work on SLS with UE support.  Pt with difficulty with technique, therefore did not add at this time.  Progressed to addressing balance in corner with wide vs narrow BOS and EO vs EC.  See pt instruction for exercises added to HEP.       Exercises   Exercises Other Exercises   Other Exercises  MIni squats x 15 reps during session for BLE strength.  Provided max cues and demo for appropriate technique, again pt able to return demo following many reps, therefore added to HEP.                  PT Education - 10/15/16 1658    Education provided Yes   Education Details exercises added to HEP  and purpose as well as education to use cane in community.     Person(s) Educated Patient   Methods Explanation   Comprehension Verbalized understanding          PT Short Term Goals - 10/01/16 2024      PT SHORT TERM GOAL #1   Title Pt will verbalize understanding of fall prevention strategies in home in order to reduce fall risk.     Time 4   Period Weeks   Status New   Target Date 10/31/16     PT SHORT TERM GOAL #2   Title Pt will be independent with initial HEP in order to indicate decreased fall risk and improved functional mobility.     Time 4   Period Weeks   Status New   Target Date 10/31/16     PT SHORT TERM GOAL #3   Title Pt will perform 5TSS <20 secs without UE support in order to indicate decreased fall risk and improved functional strength.     Time 4   Period Weeks   Status New   Target Date 10/31/16     PT SHORT TERM GOAL #4   Title Pt will improve DGI score to 16/24 in order to indicate decreased fall risk.     Time 4   Period Weeks   Status New   Target Date 10/31/16     PT SHORT TERM GOAL #5   Title Pt will ambulate up to 200' w/ LRAD at mod I level over indoor surfaces in order to indicate increased independence in home.     Time 4   Period Weeks   Status New   Target Date 10/31/16           PT Long Term Goals - 10/01/16 2029      PT LONG TERM GOAL #1   Title Pt will be independent with final HEP in order to indicate decreased fall risk and improved functional mobility.     Time 8   Period Weeks   Status New   Target Date 11/30/16     PT LONG TERM GOAL #2   Title Pt will perform 5TSS <15 secs without UE support in order to indicate decreased fall risk and improved functional strength.     Time 8   Period Weeks   Status New   Target Date 11/30/16     PT LONG TERM GOAL #3   Title Pt will improve DGI score to >/=19/24 in order to indicate decreased fall risk.  Time 8   Period Weeks   Status New   Target Date 11/30/16     PT  LONG TERM GOAL #4   Title Pt will ambulate at gait speed >/=2.62 ft/sec w/ LRAD at mod I level in order to indicate decreased fall risk and pt at community ambulator level.    Time 8   Period Weeks   Status New   Target Date 11/30/16     PT LONG TERM GOAL #5   Title Pt will ambulate over outdoor surfaces up to 500' w/ LRAD at mod I level in order to indicate pt safe community ambulator.     Time 8   Period Weeks   Status New   Target Date 11/30/16     Additional Long Term Goals   Additional Long Term Goals Yes     PT LONG TERM GOAL #6   Title Pt will demo ability to return to standing from floor with support as needed at mod I level in order to indicate safe leisure activity at home.     Time 8   Period Weeks   Status New   Target Date 11/30/16               Plan - 10/15/16 1659    Clinical Impression Statement Skilled session focused on assessment of gait with use of SPC.  Pt with marked improvement in stability and safety, therefore recommend use of SPC when in community.  Also initiated HEP for BLE strength and balance.  See pt instruction.    Rehab Potential Good   Clinical Impairments Affecting Rehab Potential co-morbidities   PT Frequency 2x / week   PT Duration 8 weeks   PT Treatment/Interventions ADLs/Self Care Home Management;DME Instruction;Gait training;Stair training;Functional mobility training;Therapeutic activities;Therapeutic exercise;Balance training;Neuromuscular re-education;Patient/family education;Manual techniques;Passive range of motion;Vestibular   PT Next Visit Plan continue gait with his cane indoors and outdoors (curb/ramp), add to HEP for BLE strength and balance as able, balance, postural exercises   Consulted and Agree with Plan of Care Patient      Patient will benefit from skilled therapeutic intervention in order to improve the following deficits and impairments:  Abnormal gait, Cardiopulmonary status limiting activity, Decreased activity  tolerance, Decreased balance, Decreased endurance, Decreased knowledge of use of DME, Decreased safety awareness, Decreased range of motion, Decreased strength, Dizziness, Impaired perceived functional ability, Impaired flexibility, Postural dysfunction, Impaired sensation  Visit Diagnosis: Unsteadiness on feet  Muscle weakness (generalized)  Other abnormalities of gait and mobility  Abnormal posture     Problem List Patient Active Problem List   Diagnosis Date Noted  . Abnormal chest x-ray 03/23/2016  . Hyperlipidemia 01/31/2015  . Coronary atherosclerosis of native coronary artery 12/04/2012  . Aneurysm of iliac artery (HCC) 08/08/2012  . ESSENTIAL HYPERTENSION, BENIGN 05/05/2010  . AV BLOCK, COMPLETE 05/05/2010  . CHRONIC DIASTOLIC HEART FAILURE 83/66/2947  . CARDIAC PACEMAKER IN SITU 05/05/2010    Cameron Sprang, PT, MPT [facility] [address] [place], [place], [address] Phone: [phone]   Fax:  [phone] 10/15/16, 5:02 PM  Name: Aamir Mclinden MRN: [id number] Date of Birth: June 10, 1920

## 2016-10-18 ENCOUNTER — Encounter: Payer: Self-pay | Admitting: Physical Therapy

## 2016-10-18 ENCOUNTER — Ambulatory Visit: Payer: Medicare Other | Admitting: Physical Therapy

## 2016-10-18 VITALS — BP 106/55 | HR 72

## 2016-10-18 DIAGNOSIS — R2681 Unsteadiness on feet: Secondary | ICD-10-CM | POA: Diagnosis not present

## 2016-10-18 DIAGNOSIS — M6281 Muscle weakness (generalized): Secondary | ICD-10-CM

## 2016-10-18 DIAGNOSIS — R2689 Other abnormalities of gait and mobility: Secondary | ICD-10-CM

## 2016-10-18 DIAGNOSIS — R293 Abnormal posture: Secondary | ICD-10-CM

## 2016-10-18 NOTE — Patient Instructions (Addendum)
Functional Quadriceps: Sit to Stand    Sit on edge of chair, feet flat on floor. Stand upright, extending knees fully.  Sit to edge of chair and lean forward!!! Sit slowly! Repeat _10___ times per set. Do __1__ sets per session. Do ___1-2_ sessions per day.  Do this every time you stand up as well.  Place chair against wall if free standing chair.      http://orth.exer.us/734   Copyright  VHI. All rights reserved.   Knee Extension: Quadriceps Double Leg Minisquat (Eccentric)    Stand with support if front of you (either a table or counter top or chair) and have a "target" behind you (a chair for example) Standing, slowly bend knees for 3-5 seconds. Then extend knees slowly. Make sure you are sticking your bottom out for chair behind you.  You should be able to see your toes at all times.   _10__ reps per set, __1-2_ sets per day, _5-7__ days per week.  http://ecce.exer.us/128     Tandem Walking    Do this along counter top so that you can hold on for support.  Hold as lightly as you can and do this very slowly! Walk with each foot directly in front of other, heel of one foot touching toes of other foot with each step. Both feet straight ahead.  Repeat 3 laps down and back.  Do 1-2 times per day    For these stand in a corner with a chair in front of you for support if you need to.    Feet Apart, Varied Arm Positions - Eyes Closed    Stand with feet shoulder width apart and arms by your side.  Close eyes and visualize upright position. Hold __20__ seconds. Repeat __3__ times per session. Do _1-2___ sessions per day.    Feet Together, Head Motion - Eyes Open    With eyes open, feet together, move head slowly: up and down x 10 reps, side to side x 10 reps.   Repeat __1_ times per session. Do 1-2__ sessions per day.  Copyright  VHI. All rights reserved. Feet Heel-Toe "Tandem"

## 2016-10-18 NOTE — Therapy (Signed)
[facility] [address] [place], [place], [address] Phone: [phone]   Fax:  [phone]  Physical Therapy Treatment  Patient Details  Name: [name] [name] MRN: [id number] Date of Birth: 06-29-1920 Referring Provider: Lavone Orn, MD  Encounter Date: 10/18/2016      PT End of Session - 10/18/16 1325    Visit Number 3   Number of Visits 17   Date for PT Re-Evaluation 11/30/16   Authorization Type UHC MCR G code on every 10th visit   PT Start Time 1318   PT Stop Time 1400   PT Time Calculation (min) 42 min   Equipment Utilized During Treatment Gait belt   Activity Tolerance Patient tolerated treatment well   Behavior During Therapy WFL for tasks assessed/performed      Past Medical History:  Diagnosis Date  . Complete heart block (HCC)    Symptomatic  . Coronary artery disease   . GERD (gastroesophageal reflux disease)   . Hearing loss   . Hypertension   . Iliac artery aneurysm, right ([place])   . Prostate cancer ([place])   . SVT (supraventricular tachycardia) (HCC)     Past Surgical History:  Procedure Laterality Date  . CARDIAC CATHETERIZATION  2007  . CATARACT EXTRACTION     Right eye  . INGUINAL HERNIA REPAIR     Right  . INSERT / REPLACE / REMOVE PACEMAKER  01/20/10   dual-chamber permanent pacemaker implantation by Dr. Cristopher Peru  . KIDNEY STONE SURGERY     Left-sided  . ROTATOR CUFF REPAIR     Right    Vitals:   10/18/16 1323  BP: (!) 106/55  Pulse: 72        Subjective Assessment - 10/18/16 1321    Subjective Reports not doing well today. Dizzy and has a headache. Reports he took his meds today, has been drinking lots of water and    Limitations House hold activities;Walking   Patient Stated Goals "I want to be evaluated to see if there is anything in particular that I can do to improve my balance."    Currently in Pain? Yes   Pain Location Head   Pain Descriptors / Indicators  Headache   Pain Onset Today   Aggravating Factors  unknown   Pain Relieving Factors tylenol             [facility] Adult PT Treatment/Exercise - 10/18/16 1326      Transfers   Transfers Sit to Stand;Stand to Sit   Sit to Stand 4: Min guard;With upper extremity assist;From bed;From chair/3-in-1   Stand to Sit 4: Min guard;With upper extremity assist;To chair/3-in-1;To bed     Ambulation/Gait   Ambulation/Gait Yes   Ambulation/Gait Assistance 4: Min guard;5: Supervision   Ambulation/Gait Assistance Details increased assistance needed on complaint/unlevel surfaces (min guard assistance) vs on indoor level surfaces. toe scuffing noted on grass with no overt loss of balance, moslty instability.    Ambulation Distance (Feet) 500 Feet   Assistive device Straight cane   Gait Pattern Step-through pattern;Decreased stride length;Trunk flexed;Narrow base of support   Ambulation Surface Level;Unlevel;Indoor;Outdoor;Paved;Gravel;Grass     High Level Balance   High Level Balance Activities Marching forwards;Marching backwards;Tandem walking  tandem, toe, heel walking fwd/bwd   High Level Balance Comments on red mats next to counter top with single UE support: 3 laps each way with min guard to min assist for balance. cues needed on ex form, base of support and weight  shifting for balance.              Balance Exercises - 10/18/16 1401      Balance Exercises: Standing   Rockerboard Anterior/posterior;Lateral;EO;10 reps;Intermittent UE support     Balance Exercises: Standing   Rebounder Limitations performed both ways on balance board: EO rocking board with emphasis on tall posture adn weight shifting. holding board steady with EO- alternating UE raises, bil UE raises, shoulder horizontal abduction and X<>Y x 10 reps each. cues on posture and weight shifting to assist with balance. min guard to min assist for balance.                                          PT Education - 10/18/16 1407     Education provided Yes   Education Details addressed pt's questions about HEP issued last session and issued new copy with tandem gait picture vs mini squat picture in that place to assist with clarity at home   Person(s) Educated Patient   Methods Explanation;Demonstration;Verbal cues;Handout   Comprehension Verbalized understanding;Returned demonstration          PT Short Term Goals - 10/01/16 2024      PT SHORT TERM GOAL #1   Title Pt will verbalize understanding of fall prevention strategies in home in order to reduce fall risk.     Time 4   Period Weeks   Status New   Target Date 10/31/16     PT SHORT TERM GOAL #2   Title Pt will be independent with initial HEP in order to indicate decreased fall risk and improved functional mobility.     Time 4   Period Weeks   Status New   Target Date 10/31/16     PT SHORT TERM GOAL #3   Title Pt will perform 5TSS <20 secs without UE support in order to indicate decreased fall risk and improved functional strength.     Time 4   Period Weeks   Status New   Target Date 10/31/16     PT SHORT TERM GOAL #4   Title Pt will improve DGI score to 16/24 in order to indicate decreased fall risk.     Time 4   Period Weeks   Status New   Target Date 10/31/16     PT SHORT TERM GOAL #5   Title Pt will ambulate up to 200' w/ LRAD at mod I level over indoor surfaces in order to indicate increased independence in home.     Time 4   Period Weeks   Status New   Target Date 10/31/16           PT Long Term Goals - 10/01/16 2029      PT LONG TERM GOAL #1   Title Pt will be independent with final HEP in order to indicate decreased fall risk and improved functional mobility.     Time 8   Period Weeks   Status New   Target Date 11/30/16     PT LONG TERM GOAL #2   Title Pt will perform 5TSS <15 secs without UE support in order to indicate decreased fall risk and improved functional strength.     Time 8   Period Weeks   Status New    Target Date 11/30/16     PT LONG TERM GOAL #3   Title Pt will improve  DGI score to >/=19/24 in order to indicate decreased fall risk.     Time 8   Period Weeks   Status New   Target Date 11/30/16     PT LONG TERM GOAL #4   Title Pt will ambulate at gait speed >/=2.62 ft/sec w/ LRAD at mod I level in order to indicate decreased fall risk and pt at community ambulator level.    Time 8   Period Weeks   Status New   Target Date 11/30/16     PT LONG TERM GOAL #5   Title Pt will ambulate over outdoor surfaces up to 500' w/ LRAD at mod I level in order to indicate pt safe community ambulator.     Time 8   Period Weeks   Status New   Target Date 11/30/16     Additional Long Term Goals   Additional Long Term Goals Yes     PT LONG TERM GOAL #6   Title Pt will demo ability to return to standing from floor with support as needed at mod I level in order to indicate safe leisure activity at home.     Time 8   Period Weeks   Status New   Target Date 11/30/16           Plan - 10/18/16 1326    Clinical Impression Statement today's skilled session continued to focus on gait with straight cane on various surfaces and balance retraining. No issues reported with session. Pt was challenged by compliant surfaces and in single leg stance duing today's session. Pt is progressing toward goals and should benefit from continued PT to progress toward unmet goals.    Rehab Potential Good   Clinical Impairments Affecting Rehab Potential co-morbidities   PT Frequency 2x / week   PT Duration 8 weeks   PT Treatment/Interventions ADLs/Self Care Home Management;DME Instruction;Gait training;Stair training;Functional mobility training;Therapeutic activities;Therapeutic exercise;Balance training;Neuromuscular re-education;Patient/family education;Manual techniques;Passive range of motion;Vestibular   PT Next Visit Plan continue gait with his cane indoors and outdoors (curb/ramp),continue to work on balance  and postural exercises   Consulted and Agree with Plan of Care Patient      Patient will benefit from skilled therapeutic intervention in order to improve the following deficits and impairments:  Abnormal gait, Cardiopulmonary status limiting activity, Decreased activity tolerance, Decreased balance, Decreased endurance, Decreased knowledge of use of DME, Decreased safety awareness, Decreased range of motion, Decreased strength, Dizziness, Impaired perceived functional ability, Impaired flexibility, Postural dysfunction, Impaired sensation  Visit Diagnosis: Unsteadiness on feet  Muscle weakness (generalized)  Other abnormalities of gait and mobility  Abnormal posture     Problem List Patient Active Problem List   Diagnosis Date Noted  . Abnormal chest x-ray 03/23/2016  . Hyperlipidemia 01/31/2015  . Coronary atherosclerosis of native coronary artery 12/04/2012  . Aneurysm of iliac artery (HCC) 08/08/2012  . ESSENTIAL HYPERTENSION, BENIGN 05/05/2010  . AV BLOCK, COMPLETE 05/05/2010  . CHRONIC DIASTOLIC HEART FAILURE 24/10/7351  . CARDIAC PACEMAKER IN SITU 05/05/2010    Willow Ora, PTA, [facility] [address], [place], [place] [address] [phone] 10/18/16, 4:28 PM   Name: [name] [name] MRN: [id number] Date of Birth: 01/31/1921

## 2016-10-22 ENCOUNTER — Ambulatory Visit: Payer: Medicare Other | Admitting: Rehabilitation

## 2016-10-27 ENCOUNTER — Ambulatory Visit: Payer: Medicare Other | Admitting: Physical Therapy

## 2016-10-28 ENCOUNTER — Ambulatory Visit: Payer: Medicare Other | Admitting: Rehabilitation

## 2016-11-01 ENCOUNTER — Ambulatory Visit: Payer: Medicare Other | Attending: Internal Medicine | Admitting: Physical Therapy

## 2016-11-01 DIAGNOSIS — R2681 Unsteadiness on feet: Secondary | ICD-10-CM | POA: Insufficient documentation

## 2016-11-05 ENCOUNTER — Ambulatory Visit: Payer: Medicare Other | Admitting: Rehabilitation

## 2016-11-08 ENCOUNTER — Ambulatory Visit: Payer: Medicare Other | Admitting: Rehabilitation

## 2016-11-12 ENCOUNTER — Ambulatory Visit: Payer: Medicare Other | Admitting: Rehabilitation

## 2016-11-15 ENCOUNTER — Encounter: Payer: Self-pay | Admitting: Rehabilitation

## 2016-11-15 ENCOUNTER — Ambulatory Visit: Payer: Medicare Other | Admitting: Rehabilitation

## 2016-11-15 DIAGNOSIS — R2681 Unsteadiness on feet: Secondary | ICD-10-CM | POA: Diagnosis not present

## 2016-11-15 NOTE — Therapy (Signed)
[facility] [address] [place], [place], [address] Phone: [phone]   Fax:  [phone]  Physical Therapy D/C Summary   Patient Details  Name: [name] [name] MRN: [id number] Date of Birth: May 31, 1920 Referring Provider: Lavone Orn, MD  Encounter Date: 11/15/2016    Past Medical History:  Diagnosis Date  . Complete heart block (HCC)    Symptomatic  . Coronary artery disease   . GERD (gastroesophageal reflux disease)   . Hearing loss   . Hypertension   . Iliac artery aneurysm, right ([place])   . Prostate cancer ([place])   . SVT (supraventricular tachycardia) (HCC)     Past Surgical History:  Procedure Laterality Date  . CARDIAC CATHETERIZATION  2007  . CATARACT EXTRACTION     Right eye  . INGUINAL HERNIA REPAIR     Right  . INSERT / REPLACE / REMOVE PACEMAKER  01/20/10   dual-chamber permanent pacemaker implantation by Dr. Cristopher Peru  . KIDNEY STONE SURGERY     Left-sided  . ROTATOR CUFF REPAIR     Right    There were no vitals filed for this visit.                                 PT Short Term Goals - 10/01/16 2024      PT SHORT TERM GOAL #1   Title Pt will verbalize understanding of fall prevention strategies in home in order to reduce fall risk.     Time 4   Period Weeks   Status New   Target Date 10/31/16     PT SHORT TERM GOAL #2   Title Pt will be independent with initial HEP in order to indicate decreased fall risk and improved functional mobility.     Time 4   Period Weeks   Status New   Target Date 10/31/16     PT SHORT TERM GOAL #3   Title Pt will perform 5TSS <20 secs without UE support in order to indicate decreased fall risk and improved functional strength.     Time 4   Period Weeks   Status New   Target Date 10/31/16     PT SHORT TERM GOAL #4   Title Pt will improve DGI score to 16/24 in order to indicate decreased fall risk.     Time 4   Period  Weeks   Status New   Target Date 10/31/16     PT SHORT TERM GOAL #5   Title Pt will ambulate up to 200' w/ LRAD at mod I level over indoor surfaces in order to indicate increased independence in home.     Time 4   Period Weeks   Status New   Target Date 10/31/16           PT Long Term Goals - 10/01/16 2029      PT LONG TERM GOAL #1   Title Pt will be independent with final HEP in order to indicate decreased fall risk and improved functional mobility.     Time 8   Period Weeks   Status New   Target Date 11/30/16     PT LONG TERM GOAL #2   Title Pt will perform 5TSS <15 secs without UE support in order to indicate decreased fall risk and improved functional strength.     Time 8   Period Weeks   Status New  Target Date 11/30/16     PT LONG TERM GOAL #3   Title Pt will improve DGI score to >/=19/24 in order to indicate decreased fall risk.     Time 8   Period Weeks   Status New   Target Date 11/30/16     PT LONG TERM GOAL #4   Title Pt will ambulate at gait speed >/=2.62 ft/sec w/ LRAD at mod I level in order to indicate decreased fall risk and pt at community ambulator level.    Time 8   Period Weeks   Status New   Target Date 11/30/16     PT LONG TERM GOAL #5   Title Pt will ambulate over outdoor surfaces up to 500' w/ LRAD at mod I level in order to indicate pt safe community ambulator.     Time 8   Period Weeks   Status New   Target Date 11/30/16     Additional Long Term Goals   Additional Long Term Goals Yes     PT LONG TERM GOAL #6   Title Pt will demo ability to return to standing from floor with support as needed at mod I level in order to indicate safe leisure activity at home.     Time 8   Period Weeks   Status New   Target Date 11/30/16             Patient will benefit from skilled therapeutic intervention in order to improve the following deficits and impairments:     Visit Diagnosis: No diagnosis found.       G-Codes - 2016/12/15  1331    Functional Assessment Tool Used (Outpatient Only) DGI: 14/24   Functional Limitation Mobility: Walking and moving around   Mobility: Walking and Moving Around Current Status [phone]) At least 40 percent but less than 60 percent impaired, limited or restricted   Mobility: Walking and Moving Around Goal Status [phone]) At least 20 percent but less than 40 percent impaired, limited or restricted   Mobility: Walking and Moving Around Discharge Status [phone]) At least 40 percent but less than 60 percent impaired, limited or restricted       PHYSICAL THERAPY DISCHARGE SUMMARY  Visits from Start of Care: 3  Current functional level related to goals / functional outcomes: Unsure as pt did not wish to return to therapy due to transportation issues.    Remaining deficits: See goals above   Education / Equipment: HEP  Plan: Patient agrees to discharge.  Patient goals were not met. Patient is being discharged due to not returning since the last visit.  ?????          Problem List Patient Active Problem List   Diagnosis Date Noted  . Abnormal chest x-ray 03/23/2016  . Hyperlipidemia 01/31/2015  . Coronary atherosclerosis of native coronary artery 12/04/2012  . Aneurysm of iliac artery (HCC) 08/08/2012  . ESSENTIAL HYPERTENSION, BENIGN 05/05/2010  . AV BLOCK, COMPLETE 05/05/2010  . CHRONIC DIASTOLIC HEART FAILURE 52/84/1324  . CARDIAC PACEMAKER IN SITU 05/05/2010    [name] [name], PT, MPT [facility] [address] [place], [place], [address] Phone: [phone]   Fax:  [phone] 15-Dec-2016, 1:33 PM  Name: [name] [name] MRN: [id number] Date of Birth: 07-Aug-1920

## 2016-11-19 ENCOUNTER — Ambulatory Visit: Payer: Medicare Other | Admitting: Rehabilitation

## 2016-11-22 ENCOUNTER — Ambulatory Visit: Payer: Medicare Other | Admitting: Physical Therapy

## 2016-11-26 ENCOUNTER — Ambulatory Visit: Payer: Medicare Other | Admitting: Physical Therapy

## 2016-11-29 ENCOUNTER — Ambulatory Visit: Payer: Medicare Other | Admitting: Physical Therapy

## 2016-12-03 ENCOUNTER — Ambulatory Visit: Payer: Medicare Other | Admitting: Physical Therapy

## 2017-03-07 ENCOUNTER — Other Ambulatory Visit: Payer: Self-pay | Admitting: Interventional Cardiology

## 2017-03-20 ENCOUNTER — Other Ambulatory Visit: Payer: Self-pay | Admitting: Interventional Cardiology

## 2017-03-21 ENCOUNTER — Other Ambulatory Visit: Payer: Self-pay | Admitting: Interventional Cardiology

## 2017-03-21 NOTE — Progress Notes (Signed)
6    Cardiology Office Note   Date:  03/23/2017   ID:  [name] [name], DOB 09-08-1920, MRN [id number]  PCP:  [name] Orn, MD    No chief complaint on file.  CAD  Wt Readings from Last 3 Encounters:  03/23/17 153 lb 12.8 oz (69.8 kg)  04/02/16 147 lb 6.4 oz (66.9 kg)  03/23/16 152 lb (68.9 kg)       History of Present Illness: [name] [name] is a 84 y.o. male  with CAD, (LAD DES in 2005-06-03) and pacemaker placement in June 03, 2009.  Wife passed away  in 12-04-12.  He has chronically reported fatigue.  DOE was treated with Lasix at varying doses based on BNP in the past.   Biggest trouble is runny nose and cough, likely related to allergies.  He continues to work outdoors and does all of the housework.  He feels ok doing this.  He gets [facility] after 30 minutes.  He rests for few minutes and then can go back to work.    Denies : Chest pain. Dizziness.  Nitroglycerin use. Orthopnea. Palpitations. Paroxysmal nocturnal dyspnea. Syncope.   No sx like what he had before his stent.  Intermittent leg edema.     Past Medical History:  Diagnosis Date  . Complete heart block (HCC)    Symptomatic  . Coronary artery disease   . GERD (gastroesophageal reflux disease)   . Hearing loss   . Hypertension   . Iliac artery aneurysm, right ([name] [name])   . Prostate cancer ([name] [name])   . SVT (supraventricular tachycardia) (HCC)     Past Surgical History:  Procedure Laterality Date  . CARDIAC CATHETERIZATION  03-Jun-2005  . CATARACT EXTRACTION     Right eye  . INGUINAL HERNIA REPAIR     Right  . INSERT / REPLACE / REMOVE PACEMAKER  01/20/10   dual-chamber permanent pacemaker implantation by Dr. Cristopher [name]  . KIDNEY STONE SURGERY     Left-sided  . ROTATOR CUFF REPAIR     Right     Current Outpatient Medications  Medication Sig Dispense Refill  . acetaminophen (TYLENOL) 500 MG tablet Take 500 mg by mouth every 6 (six) hours as needed (pain).     Marland Kitchen b complex vitamins tablet Take 1 tablet by mouth daily.       . clopidogrel (PLAVIX) 75 MG tablet Take 1 tablet (75 mg total) by mouth daily. 90 tablet 0  . docusate sodium (COLACE) 100 MG capsule Take 100 mg by mouth daily as needed for mild constipation.    . furosemide (LASIX) 40 MG tablet Take 40 mg every other day with alternating dose of 40 mg twice a day. Please keep upcoming appt for future refills. Thank you 135 tablet 0  . isosorbide mononitrate (IMDUR) 30 MG 24 hr tablet Take 1 tablet (30 mg total) by mouth daily. 30 tablet 3  . losartan (COZAAR) 100 MG tablet Take 1 tablet (100 mg total) by mouth daily. (Patient taking differently: Take 50 mg by mouth daily. ) 90 tablet 2  . metoprolol succinate (TOPROL-XL) 25 MG 24 hr tablet Take 1 tablet (25 mg total) by mouth daily. Please keep upcoming appointment for further refills 90 tablet 0  . Multiple Vitamins-Minerals (CENTRUM SILVER) tablet Take 1 tablet by mouth daily.      . Multiple Vitamins-Minerals (ICAPS MV) TABS Take 1 tablet by mouth daily.      Marland Kitchen omeprazole (PRILOSEC) 40 MG capsule daily.    . potassium chloride (  MICRO-K) 10 MEQ CR capsule Take 1 capsule by mouth daily.    . pravastatin (PRAVACHOL) 20 MG tablet Take 20 mg by mouth daily.     No current facility-administered medications for this visit.     Allergies:   Penicillins    Social History:  The patient  reports that he quit smoking about 54 years ago. His smoking use included cigarettes. he has never used smokeless tobacco. He reports that he does not drink alcohol or use drugs.   Family History:  The patient's family history includes Breast cancer in his mother; CVA in his father; Cancer in his sister; Hypertension in his father; Prostate cancer in his brother and brother; Stroke in his father.    ROS:  Please see the history of present illness.   Otherwise, review of systems are positive for easy bruising.   All other systems are reviewed and negative.    PHYSICAL EXAM: VS:  BP 108/62   Pulse 75   Ht 5' 10.5" (1.791  m)   Wt 153 lb 12.8 oz (69.8 kg)   SpO2 99%   BMI 21.76 kg/m  , BMI Body mass index is 21.76 kg/m. GEN: Well nourished, well developed, in no acute distress  HEENT: normal  Neck: no JVD, carotid bruits, or masses Cardiac: RRR, premature beats; no murmurs, rubs, or gallops,no edema  Respiratory:  clear to auscultation bilaterally, normal work of breathing GI: soft, nontender, nondistended, + BS MS: no deformity or atrophy  Skin: warm and dry,; mild rash on back, papular Neuro:  Strength and sensation are intact Psych: euthymic mood, full affect   EKG:   The ekg ordered today demonstrates AV paced, PVCs   Recent Labs: No results found for requested labs within last 8760 hours.   Lipid Panel No results found for: CHOL, TRIG, HDL, CHOLHDL, VLDL, LDLCALC, LDLDIRECT   Other studies Reviewed: Additional studies/ records that were reviewed today with results demonstrating: Labs show Cr 1.59 in July 2018.   ASSESSMENT AND PLAN:  1. CAD/DOE: Taxus stent to the mid LAD in 2007.  No angina.  COntinue aggressive secondary prevention. Clopidogrel monotherapy. 2. PAD: Iliac aneurysm, 22 mm at last check. 3. Hyperlipidemia: LDL 63 in July 2018.  COntinue pravastatin. 4. Chronic diastolic heart failure: Appears euvolemic.  He remains on Lasix.  5. Pacer: Continue regular checks.   Current medicines are reviewed at length with the patient today.  The patient concerns regarding his medicines were addressed.  The following changes have been made:  No change  Labs/ tests ordered today include:  No orders of the defined types were placed in this encounter.   Recommend 150 minutes/week of aerobic exercise Low fat, low carb, high fiber diet recommended  Disposition:   FU in 1 year   Signed, [name] Grooms, MD  03/23/2017 3:18 PM    [facility] [place], [place], [place]  [address] Phone: [phone]; Fax: [phone]

## 2017-03-23 ENCOUNTER — Encounter: Payer: Self-pay | Admitting: Interventional Cardiology

## 2017-03-23 ENCOUNTER — Ambulatory Visit (INDEPENDENT_AMBULATORY_CARE_PROVIDER_SITE_OTHER): Payer: Medicare Other | Admitting: Interventional Cardiology

## 2017-03-23 VITALS — BP 108/62 | HR 75 | Ht 70.5 in | Wt 153.8 lb

## 2017-03-23 DIAGNOSIS — Z950 Presence of cardiac pacemaker: Secondary | ICD-10-CM | POA: Diagnosis not present

## 2017-03-23 DIAGNOSIS — I2510 Atherosclerotic heart disease of native coronary artery without angina pectoris: Secondary | ICD-10-CM | POA: Diagnosis not present

## 2017-03-23 DIAGNOSIS — I10 Essential (primary) hypertension: Secondary | ICD-10-CM | POA: Diagnosis not present

## 2017-03-23 DIAGNOSIS — I5032 Chronic diastolic (congestive) heart failure: Secondary | ICD-10-CM

## 2017-03-23 DIAGNOSIS — E782 Mixed hyperlipidemia: Secondary | ICD-10-CM

## 2017-03-23 NOTE — Patient Instructions (Signed)

## 2017-04-05 ENCOUNTER — Encounter: Payer: Self-pay | Admitting: Internal Medicine

## 2017-04-05 ENCOUNTER — Ambulatory Visit (INDEPENDENT_AMBULATORY_CARE_PROVIDER_SITE_OTHER): Payer: Medicare Other | Admitting: Internal Medicine

## 2017-04-05 VITALS — BP 126/56 | HR 77 | Ht 70.5 in | Wt 153.0 lb

## 2017-04-05 DIAGNOSIS — I442 Atrioventricular block, complete: Secondary | ICD-10-CM

## 2017-04-05 DIAGNOSIS — Z950 Presence of cardiac pacemaker: Secondary | ICD-10-CM | POA: Diagnosis not present

## 2017-04-05 NOTE — Patient Instructions (Signed)
Medication Instructions:  Your physician recommends that you continue on your current medications as directed. Please refer to the Current Medication list given to you today.  Labwork: None ordered.  Testing/Procedures: None ordered.  Follow-Up: You will follow up with the device clinic in 6 months for a device check.  Your physician wants you to follow-up in: one year with Dr. Lovena Le.   You will receive a reminder letter in the mail two months in advance. If you don't receive a letter, please call our office to schedule the follow-up appointment.  Any Other Special Instructions Will Be Listed Below (If Applicable).  If you need a refill on your cardiac medications before your next appointment, please call your pharmacy.

## 2017-04-05 NOTE — Progress Notes (Signed)
HPI [name] returns today for ongoing evaluation and management of CHB, s/p PPM insertion. In the interim he has done well with no chest pain or sob. He remains active. No edema. His appetite is good and he does not have any history of falls. He does note that he will get dizzy if he gets up too fast. Allergies  Allergen Reactions  . Penicillins Anaphylaxis     Current Outpatient Medications  Medication Sig Dispense Refill  . acetaminophen (TYLENOL) 500 MG tablet Take 500 mg by mouth every 6 (six) hours as needed (pain).     Marland Kitchen b complex vitamins tablet Take 1 tablet by mouth daily.      . clopidogrel (PLAVIX) 75 MG tablet Take 1 tablet (75 mg total) by mouth daily. 90 tablet 0  . docusate sodium (COLACE) 100 MG capsule Take 100 mg by mouth daily as needed for mild constipation.    . furosemide (LASIX) 40 MG tablet Take 40 mg every other day with alternating dose of 40 mg twice a day. Please keep upcoming appt for future refills. Thank you 135 tablet 0  . isosorbide mononitrate (IMDUR) 30 MG 24 hr tablet Take 1 tablet (30 mg total) by mouth daily. 30 tablet 3  . losartan (COZAAR) 100 MG tablet Take 1 tablet (100 mg total) by mouth daily. (Patient taking differently: Take 50 mg by mouth daily. ) 90 tablet 2  . metoprolol succinate (TOPROL-XL) 25 MG 24 hr tablet Take 1 tablet (25 mg total) by mouth daily. Please keep upcoming appointment for further refills 90 tablet 0  . Multiple Vitamins-Minerals (CENTRUM SILVER) tablet Take 1 tablet by mouth daily.      . Multiple Vitamins-Minerals (ICAPS MV) TABS Take 1 tablet by mouth daily.      Marland Kitchen omeprazole (PRILOSEC) 40 MG capsule daily.    . potassium chloride (MICRO-K) 10 MEQ CR capsule Take 1 capsule by mouth daily.    . pravastatin (PRAVACHOL) 20 MG tablet Take 20 mg by mouth daily.     No current facility-administered medications for this visit.      Past Medical History:  Diagnosis Date  . Complete heart block (HCC)    Symptomatic  . Coronary artery disease   . GERD (gastroesophageal reflux disease)   . Hearing loss   . Hypertension   . Iliac artery aneurysm, right ([place])   . Prostate cancer ([place])   . SVT (supraventricular tachycardia) (HCC)     ROS:   All systems reviewed and negative except as noted in the HPI.   Past Surgical History:  Procedure Laterality Date  . CARDIAC CATHETERIZATION  2007  . CATARACT EXTRACTION     Right eye  . INGUINAL HERNIA REPAIR     Right  . INSERT / REPLACE / REMOVE PACEMAKER  01/20/10   dual-chamber permanent pacemaker implantation by Dr. Cristopher Peru  . KIDNEY STONE SURGERY     Left-sided  . ROTATOR CUFF REPAIR     Right     Family History  Problem Relation Age of Onset  . Breast cancer Mother   . Hypertension Father   . CVA Father   . Stroke Father   . Prostate cancer Brother   . Prostate cancer Brother   . Cancer Sister        esophageal  . Heart attack Neg Hx      Social History   Socioeconomic History  . Marital status: Widowed    Spouse  name: Not on file  . Number of children: Not on file  . Years of education: Not on file  . Highest education level: Not on file  Social Needs  . Financial resource strain: Not on file  . Food insecurity - worry: Not on file  . Food insecurity - inability: Not on file  . Transportation needs - medical: Not on file  . Transportation needs - non-medical: Not on file  Occupational History  . Occupation: Retired    Fish farm manager: RETIRED  Tobacco Use  . Smoking status: Former Smoker    Types: Cigarettes    Last attempt to quit: 08/09/1962    Years since quitting: 54.6  . Smokeless tobacco: Never Used  . Tobacco comment: Quit smoking many years ago.  Substance and Sexual Activity  . Alcohol use: No  . Drug use: No  . Sexual activity: Not on file  Other Topics Concern  . Not on file  Social History Narrative  . Not on file     BP (!) 126/56   Pulse 77   Ht 5' 10.5" (1.791 m)   Wt 153 lb (69.4  kg)   BMI 21.64 kg/m   Physical Exam:  Well appearing elderly man, NAD HEENT: Unremarkable Neck:  6 cm JVD, no thyromegally Lymphatics:  No adenopathy Back:  No CVA tenderness Lungs:  Clear with no wheezes HEART:  Regular rate rhythm, no murmurs, no rubs, no clicks Abd:  soft, positive bowel sounds, no organomegally, no rebound, no guarding Ext:  2 plus pulses, no edema, no cyanosis, no clubbing Skin:  No rashes no nodules Neuro:  CN II through XII intact, motor grossly intact  EKG - none  DEVICE  Normal device function.  See PaceArt for details.   Assess/Plan: 1. CHB - he is asymptomatic s/p PPM insertion.  2. Chronic diastolic heart failure - he will continue his current meds. He is encouraged to maintain a low sodium diet. 3. PPM - his medtronic DDD PM is working normally. He has just over 2 year of battery longevity.  Mikle Bosworth.D.

## 2017-04-06 LAB — CUP PACEART INCLINIC DEVICE CHECK
Battery Impedance: 1984 Ohm
Battery Voltage: 2.75 V
Brady Statistic AP VP Percent: 52 %
Brady Statistic AS VP Percent: 47 %
Date Time Interrogation Session: 20190212201031
Implantable Lead Implant Date: 20111129
Implantable Lead Location: 753859
Implantable Lead Location: 753860
Implantable Lead Model: 5076
Implantable Pulse Generator Implant Date: 20111129
Lead Channel Impedance Value: 463 Ohm
Lead Channel Pacing Threshold Amplitude: 0.5 V
Lead Channel Pacing Threshold Pulse Width: 0.4 ms
Lead Channel Pacing Threshold Pulse Width: 0.4 ms
Lead Channel Pacing Threshold Pulse Width: 0.4 ms
Lead Channel Sensing Intrinsic Amplitude: 2 mV
Lead Channel Setting Pacing Amplitude: 2 V
MDC IDC LEAD IMPLANT DT: 20111129
MDC IDC MSMT BATTERY REMAINING LONGEVITY: 30 mo
MDC IDC MSMT LEADCHNL RA PACING THRESHOLD AMPLITUDE: 0.5 V
MDC IDC MSMT LEADCHNL RV IMPEDANCE VALUE: 958 Ohm
MDC IDC MSMT LEADCHNL RV PACING THRESHOLD AMPLITUDE: 0.625 V
MDC IDC MSMT LEADCHNL RV PACING THRESHOLD AMPLITUDE: 0.75 V
MDC IDC MSMT LEADCHNL RV PACING THRESHOLD PULSEWIDTH: 0.4 ms
MDC IDC SET LEADCHNL RV PACING AMPLITUDE: 2.5 V
MDC IDC SET LEADCHNL RV PACING PULSEWIDTH: 0.4 ms
MDC IDC SET LEADCHNL RV SENSING SENSITIVITY: 2.8 mV
MDC IDC STAT BRADY AP VS PERCENT: 0 %
MDC IDC STAT BRADY AS VS PERCENT: 1 %

## 2017-04-15 ENCOUNTER — Encounter: Payer: Self-pay | Admitting: Internal Medicine

## 2017-06-05 ENCOUNTER — Other Ambulatory Visit: Payer: Self-pay | Admitting: Interventional Cardiology

## 2017-06-19 ENCOUNTER — Other Ambulatory Visit: Payer: Self-pay | Admitting: Interventional Cardiology

## 2017-10-05 ENCOUNTER — Ambulatory Visit (INDEPENDENT_AMBULATORY_CARE_PROVIDER_SITE_OTHER): Payer: Medicare Other | Admitting: *Deleted

## 2017-10-05 DIAGNOSIS — I442 Atrioventricular block, complete: Secondary | ICD-10-CM

## 2017-10-07 LAB — CUP PACEART INCLINIC DEVICE CHECK
Battery Remaining Longevity: 26 mo
Battery Voltage: 2.73 V
Brady Statistic AP VS Percent: 1 %
Brady Statistic AS VP Percent: 37 %
Implantable Lead Location: 753859
Implantable Lead Model: 5076
Implantable Pulse Generator Implant Date: 20111129
Lead Channel Impedance Value: 996 Ohm
Lead Channel Pacing Threshold Amplitude: 0.5 V
Lead Channel Pacing Threshold Amplitude: 0.625 V
Lead Channel Pacing Threshold Amplitude: 0.75 V
Lead Channel Pacing Threshold Pulse Width: 0.4 ms
Lead Channel Pacing Threshold Pulse Width: 0.4 ms
Lead Channel Pacing Threshold Pulse Width: 0.4 ms
Lead Channel Setting Pacing Amplitude: 2.5 V
Lead Channel Setting Sensing Sensitivity: 4 mV
MDC IDC LEAD IMPLANT DT: 20111129
MDC IDC LEAD IMPLANT DT: 20111129
MDC IDC LEAD LOCATION: 753860
MDC IDC MSMT BATTERY IMPEDANCE: 2241 Ohm
MDC IDC MSMT LEADCHNL RA IMPEDANCE VALUE: 484 Ohm
MDC IDC MSMT LEADCHNL RA PACING THRESHOLD AMPLITUDE: 0.625 V
MDC IDC MSMT LEADCHNL RA SENSING INTR AMPL: 2 mV
MDC IDC MSMT LEADCHNL RV PACING THRESHOLD PULSEWIDTH: 0.4 ms
MDC IDC SESS DTM: 20190814120757
MDC IDC SET LEADCHNL RA PACING AMPLITUDE: 2 V
MDC IDC SET LEADCHNL RV PACING PULSEWIDTH: 0.4 ms
MDC IDC STAT BRADY AP VP PERCENT: 60 %
MDC IDC STAT BRADY AS VS PERCENT: 2 %

## 2017-10-07 NOTE — Progress Notes (Signed)
Pacemaker check in clinic. Normal device function. Thresholds, sensing, impedances consistent with previous measurements. Device programmed to maximize longevity. No mode switch episodes. (2) VT-NS episodes noted, max dur. >/=27bts. Device programmed at appropriate safety margins. Histogram distribution appropriate for patient activity level. Device programmed to optimize intrinsic conduction. Estimated longevity 42months (range: 9-43 months). Patient will follow up with [facility] in 03-2018. Patient education completed.

## 2018-03-18 ENCOUNTER — Other Ambulatory Visit: Payer: Self-pay | Admitting: Interventional Cardiology

## 2018-03-28 ENCOUNTER — Ambulatory Visit (INDEPENDENT_AMBULATORY_CARE_PROVIDER_SITE_OTHER): Payer: Medicare Other | Admitting: Interventional Cardiology

## 2018-03-28 ENCOUNTER — Encounter: Payer: Self-pay | Admitting: Interventional Cardiology

## 2018-03-28 ENCOUNTER — Encounter (INDEPENDENT_AMBULATORY_CARE_PROVIDER_SITE_OTHER): Payer: Self-pay

## 2018-03-28 VITALS — BP 112/68 | HR 77 | Ht 70.5 in | Wt 146.6 lb

## 2018-03-28 DIAGNOSIS — I5032 Chronic diastolic (congestive) heart failure: Secondary | ICD-10-CM | POA: Diagnosis not present

## 2018-03-28 DIAGNOSIS — I10 Essential (primary) hypertension: Secondary | ICD-10-CM | POA: Diagnosis not present

## 2018-03-28 DIAGNOSIS — Z950 Presence of cardiac pacemaker: Secondary | ICD-10-CM

## 2018-03-28 DIAGNOSIS — E782 Mixed hyperlipidemia: Secondary | ICD-10-CM | POA: Diagnosis not present

## 2018-03-28 DIAGNOSIS — I25118 Atherosclerotic heart disease of native coronary artery with other forms of angina pectoris: Secondary | ICD-10-CM

## 2018-03-28 NOTE — Progress Notes (Signed)
Cardiology Office Note   Date:  03/28/2018   ID:  [name] [name], DOB [birth year], MRN [id number]  PCP:  Lavone Orn, MD    No chief complaint on file.  CAD  Wt Readings from Last 3 Encounters:  03/28/18 146 lb 9.6 oz (66.5 kg)  04/05/17 153 lb (69.4 kg)  03/23/17 153 lb 12.8 oz (69.8 kg)       History of Present Illness: [name] [name] is a 85 y.o. male  with CAD, (LAD DES in 2005-05-28) and pacemaker placement in 05/28/09.  Wife passed away in 11/28/12.  He has chronically reported fatigue.  DOE was treated with Lasix at varying doses based on BNP in the past.   In the past, Biggest trouble is runny nose and cough, likely related to allergies.  Since the last visit, he has fallen afew times.  Once was while he was getting out of a pickup truck.  He injured his leg.    Denies : Chest pain. Dizziness. Leg edema. Nitroglycerin use. Orthopnea. Palpitations. Paroxysmal nocturnal dyspnea. Shortness of breath. Syncope.   No bleeding problems.   He feels some tingling in his feet.   Past Medical History:  Diagnosis Date  . Complete heart block (HCC)    Symptomatic  . Coronary artery disease   . GERD (gastroesophageal reflux disease)   . Hearing loss   . Hypertension   . Iliac artery aneurysm, right ([place])   . Prostate cancer ([place])   . SVT (supraventricular tachycardia) (HCC)     Past Surgical History:  Procedure Laterality Date  . CARDIAC CATHETERIZATION  28-May-2005  . CATARACT EXTRACTION     Right eye  . INGUINAL HERNIA REPAIR     Right  . INSERT / REPLACE / REMOVE PACEMAKER  01/20/10   dual-chamber permanent pacemaker implantation by Dr. Cristopher Peru  . KIDNEY STONE SURGERY     Left-sided  . ROTATOR CUFF REPAIR     Right     Current Outpatient Medications  Medication Sig Dispense Refill  . acetaminophen (TYLENOL) 500 MG tablet Take 500 mg by mouth every 6 (six) hours as needed (pain).     Marland Kitchen b complex vitamins tablet Take 1 tablet by mouth daily.      .  clopidogrel (PLAVIX) 75 MG tablet TAKE 1 TABLET DAILY 90 tablet 3  . docusate sodium (COLACE) 100 MG capsule Take 100 mg by mouth daily as needed for mild constipation.    . furosemide (LASIX) 40 MG tablet TAKE 1 TABLET EVERY OTHER DAY, ALTERNATING DOSE OF 1 TABLET TWICE A DAY. Please keep upcoming appt in February. Thank you 135 tablet 0  . isosorbide mononitrate (IMDUR) 30 MG 24 hr tablet Take 1 tablet (30 mg total) by mouth daily. 30 tablet 3  . losartan (COZAAR) 50 MG tablet Take 50 mg by mouth daily.    . metoprolol succinate (TOPROL-XL) 25 MG 24 hr tablet TAKE 1 TABLET DAILY 90 tablet 0  . Multiple Vitamins-Minerals (CENTRUM SILVER) tablet Take 1 tablet by mouth daily.      . Multiple Vitamins-Minerals (ICAPS MV) TABS Take 1 tablet by mouth daily.      Marland Kitchen omeprazole (PRILOSEC) 40 MG capsule daily.    . potassium chloride (MICRO-K) 10 MEQ CR capsule Take 1 capsule by mouth daily.    . pravastatin (PRAVACHOL) 20 MG tablet Take 20 mg by mouth daily.     No current facility-administered medications for this visit.     Allergies:  Penicillins    Social History:  The patient  reports that he quit smoking about 55 years ago. His smoking use included cigarettes. He has never used smokeless tobacco. He reports that he does not drink alcohol or use drugs.   Family History:  The patient's family history includes Breast cancer in his mother; CVA in his father; Cancer in his sister; Hypertension in his father; Prostate cancer in his brother and brother; Stroke in his father.    ROS:  Please see the history of present illness.   Otherwise, review of systems are positive for feet tingling.   All other systems are reviewed and negative.    PHYSICAL EXAM: VS:  BP 112/68   Pulse 77   Ht 5' 10.5" (1.791 m)   Wt 146 lb 9.6 oz (66.5 kg)   SpO2 97%   BMI 20.74 kg/m  , BMI Body mass index is 20.74 kg/m. GEN: Well nourished, well developed, in no acute distress  HEENT: normal  Neck: no JVD,  carotid bruits, or masses Cardiac: RRR; no murmurs, rubs, or gallops,no edema ; varicose veins Respiratory:  clear to auscultation bilaterally, normal work of breathing GI: soft, nontender, nondistended, + BS MS: no deformity or atrophy  Skin: warm and dry, no rash Neuro:  Strength and sensation are intact Psych: euthymic mood, full affect   EKG:   The ekg ordered today demonstrates paced rhythm   Recent Labs: No results found for requested labs within last 8760 hours.   Lipid Panel No results found for: CHOL, TRIG, HDL, CHOLHDL, VLDL, LDLCALC, LDLDIRECT   Other studies Reviewed: Additional studies/ records that were reviewed today with results demonstrating: Labs reviewed.   ASSESSMENT AND PLAN:  1. CAD/DOE: Taxus stent to the mid LAD in 2007.  Has been on clopidogrel monotherapy.  Try to increase activity safely. Avoid falling.  2. PAD: Known iliac aneurysm.  No pain.  It was stable and it was decided to not check it any more.   3. Hyperlipidemia: LDL controlled.  COntinue pravastatin.  4. Chronic diastolic heart failure: Appears euvolemic.  5. Pacer: Checked with Dr. Lovena Le.    Current medicines are reviewed at length with the patient today.  The patient concerns regarding his medicines were addressed.  The following changes have been made:  No change  Labs/ tests ordered today include:  No orders of the defined types were placed in this encounter.   Recommend 150 minutes/week of aerobic exercise Low fat, low carb, high fiber diet recommended  Disposition:   FU in 1 year   Signed, Larae Grooms, MD  03/28/2018 [facility] [place], [place], [place]  [address] Phone: [phone]; Fax: [phone]

## 2018-03-28 NOTE — Patient Instructions (Signed)

## 2018-04-07 ENCOUNTER — Encounter: Payer: Self-pay | Admitting: Internal Medicine

## 2018-04-07 ENCOUNTER — Ambulatory Visit (INDEPENDENT_AMBULATORY_CARE_PROVIDER_SITE_OTHER): Payer: Medicare Other | Admitting: Internal Medicine

## 2018-04-07 VITALS — BP 120/56 | HR 80 | Ht 70.5 in | Wt 146.0 lb

## 2018-04-07 DIAGNOSIS — I5032 Chronic diastolic (congestive) heart failure: Secondary | ICD-10-CM

## 2018-04-07 DIAGNOSIS — I442 Atrioventricular block, complete: Secondary | ICD-10-CM

## 2018-04-07 DIAGNOSIS — Z950 Presence of cardiac pacemaker: Secondary | ICD-10-CM

## 2018-04-07 NOTE — Patient Instructions (Addendum)
Medication Instructions:  Your physician recommends that you continue on your current medications as directed. Please refer to the Current Medication list given to you today.  Labwork: None ordered.  Testing/Procedures: None ordered.  Follow-Up:  Your physician wants you to follow-up in: 6 months with device clinic for a device check.     Your physician wants you to follow-up in: one year with Dr. Taylor.   You will receive a reminder letter in the mail two months in advance. If you don't receive a letter, please call our office to schedule the follow-up appointment.  Any Other Special Instructions Will Be Listed Below (If Applicable).  If you need a refill on your cardiac medications before your next appointment, please call your pharmacy.   

## 2018-04-07 NOTE — Progress Notes (Signed)
HPI [name] returns today for followup. He is a pleasant 85 yo man with a h/o CHB, s/p PPM insertion. Inb the interim, he has done well with no chest pain or sob. Minimal peripheral edema. No syncope.  Allergies  Allergen Reactions  . Penicillins Anaphylaxis     Current Outpatient Medications  Medication Sig Dispense Refill  . acetaminophen (TYLENOL) 500 MG tablet Take 500 mg by mouth every 6 (six) hours as needed (pain).     Marland Kitchen b complex vitamins tablet Take 1 tablet by mouth daily.      . clopidogrel (PLAVIX) 75 MG tablet TAKE 1 TABLET DAILY 90 tablet 3  . docusate sodium (COLACE) 100 MG capsule Take 100 mg by mouth daily as needed for mild constipation.    . furosemide (LASIX) 40 MG tablet TAKE 1 TABLET EVERY OTHER DAY, ALTERNATING DOSE OF 1 TABLET TWICE A DAY. Please keep upcoming appt in February. Thank you 135 tablet 0  . losartan (COZAAR) 50 MG tablet Take 50 mg by mouth daily.    . metoprolol succinate (TOPROL-XL) 25 MG 24 hr tablet TAKE 1 TABLET DAILY 90 tablet 0  . Multiple Vitamins-Minerals (CENTRUM SILVER) tablet Take 1 tablet by mouth daily.      . Multiple Vitamins-Minerals (ICAPS MV) TABS Take 1 tablet by mouth daily.      Marland Kitchen omeprazole (PRILOSEC) 40 MG capsule daily.    . potassium chloride (MICRO-K) 10 MEQ CR capsule Take 1 capsule by mouth daily.    . pravastatin (PRAVACHOL) 20 MG tablet Take 20 mg by mouth daily.     No current facility-administered medications for this visit.      Past Medical History:  Diagnosis Date  . Complete heart block (HCC)    Symptomatic  . Coronary artery disease   . GERD (gastroesophageal reflux disease)   . Hearing loss   . Hypertension   . Iliac artery aneurysm, right ([place])   . Prostate cancer ([place])   . SVT (supraventricular tachycardia) (HCC)     ROS:   All systems reviewed and negative except as noted in the HPI.   Past Surgical History:  Procedure Laterality Date  . CARDIAC CATHETERIZATION  2007  . CATARACT  EXTRACTION     Right eye  . INGUINAL HERNIA REPAIR     Right  . INSERT / REPLACE / REMOVE PACEMAKER  01/20/10   dual-chamber permanent pacemaker implantation by Dr. Cristopher Peru  . KIDNEY STONE SURGERY     Left-sided  . ROTATOR CUFF REPAIR     Right     Family History  Problem Relation Age of Onset  . Breast cancer Mother   . Hypertension Father   . CVA Father   . Stroke Father   . Prostate cancer Brother   . Prostate cancer Brother   . Cancer Sister        esophageal  . Heart attack Neg Hx      Social History   Socioeconomic History  . Marital status: Widowed    Spouse name: Not on file  . Number of children: Not on file  . Years of education: Not on file  . Highest education level: Not on file  Occupational History  . Occupation: Retired    Fish farm manager: RETIRED  Social Needs  . Financial resource strain: Not on file  . Food insecurity:    Worry: Not on file    Inability: Not on file  . Transportation needs:  Medical: Not on file    Non-medical: Not on file  Tobacco Use  . Smoking status: Former Smoker    Types: Cigarettes    Last attempt to quit: 08/09/1962    Years since quitting: 55.6  . Smokeless tobacco: Never Used  . Tobacco comment: Quit smoking many years ago.  Substance and Sexual Activity  . Alcohol use: No  . Drug use: No  . Sexual activity: Not on file  Lifestyle  . Physical activity:    Days per week: Not on file    Minutes per session: Not on file  . Stress: Not on file  Relationships  . Social connections:    Talks on phone: Not on file    Gets together: Not on file    Attends religious service: Not on file    Active member of club or organization: Not on file    Attends meetings of clubs or organizations: Not on file    Relationship status: Not on file  . Intimate partner violence:    Fear of current or ex partner: Not on file    Emotionally abused: Not on file    Physically abused: Not on file    Forced sexual activity: Not on  file  Other Topics Concern  . Not on file  Social History Narrative  . Not on file     BP (!) 120/56   Pulse 80   Ht 5' 10.5" (1.791 m)   Wt 146 lb (66.2 kg)   BMI 20.65 kg/m   Physical Exam:  Well appearing elderly man, NAD HEENT: Unremarkable Neck:  6 cm JVD, no thyromegally Lymphatics:  No adenopathy Back:  No CVA tenderness Lungs:  Clear with no wheezes HEART:  Regular rate rhythm, no murmurs, no rubs, no clicks Abd:  soft, positive bowel sounds, no organomegally, no rebound, no guarding Ext:  2 plus pulses, no edema, no cyanosis, no clubbing Skin:  No rashes no nodules Neuro:  CN II through XII intact, motor grossly intact   DEVICE  Normal device function.  See PaceArt for details.   Assess/Plan: 1. Chronic diastolic heart failure - his symptoms are class 2. He will continue his currentmeds. 2. CHB - he is asymptomatic, s/p PPM insertion.  3. PPM -his Medtronic DDD PM has been interrogated today and is working normally. He will followup in several months.  Mikle Bosworth.D.

## 2018-04-11 LAB — CUP PACEART INCLINIC DEVICE CHECK
Battery Impedance: 2385 Ohm
Battery Remaining Longevity: 25 mo
Battery Voltage: 2.71 V
Brady Statistic AP VP Percent: 58 %
Brady Statistic AP VS Percent: 0 %
Brady Statistic AS VP Percent: 41 %
Brady Statistic AS VS Percent: 1 %
Implantable Lead Implant Date: 20111129
Implantable Lead Implant Date: 20111129
Implantable Lead Location: 753859
Implantable Lead Location: 753860
Implantable Lead Model: 5076
Implantable Lead Model: 5076
Implantable Pulse Generator Implant Date: 20111129
Lead Channel Impedance Value: 506 Ohm
Lead Channel Impedance Value: 986 Ohm
Lead Channel Pacing Threshold Amplitude: 0.5 V
Lead Channel Pacing Threshold Amplitude: 0.75 V
Lead Channel Pacing Threshold Amplitude: 0.75 V
Lead Channel Pacing Threshold Amplitude: 0.75 V
Lead Channel Pacing Threshold Pulse Width: 0.4 ms
Lead Channel Pacing Threshold Pulse Width: 0.4 ms
Lead Channel Pacing Threshold Pulse Width: 0.4 ms
Lead Channel Pacing Threshold Pulse Width: 0.4 ms
Lead Channel Sensing Intrinsic Amplitude: 2.8 mV
Lead Channel Setting Pacing Amplitude: 2 V
Lead Channel Setting Pacing Amplitude: 2.5 V
Lead Channel Setting Pacing Pulse Width: 0.4 ms
Lead Channel Setting Sensing Sensitivity: 4 mV
MDC IDC SESS DTM: 20200214173937

## 2018-05-31 ENCOUNTER — Other Ambulatory Visit: Payer: Self-pay | Admitting: Interventional Cardiology

## 2018-06-18 ENCOUNTER — Other Ambulatory Visit: Payer: Self-pay | Admitting: Interventional Cardiology

## 2018-06-19 ENCOUNTER — Other Ambulatory Visit: Payer: Self-pay | Admitting: Interventional Cardiology

## 2018-10-02 ENCOUNTER — Other Ambulatory Visit: Payer: Self-pay

## 2018-10-02 ENCOUNTER — Ambulatory Visit (INDEPENDENT_AMBULATORY_CARE_PROVIDER_SITE_OTHER): Payer: Medicare Other | Admitting: Nurse Practitioner

## 2018-10-02 DIAGNOSIS — I442 Atrioventricular block, complete: Secondary | ICD-10-CM

## 2018-10-02 LAB — CUP PACEART INCLINIC DEVICE CHECK
Battery Impedance: 3034 Ohm
Battery Remaining Longevity: 19 mo
Battery Voltage: 2.72 V
Brady Statistic AP VP Percent: 69 %
Brady Statistic AP VS Percent: 0 %
Brady Statistic AS VP Percent: 30 %
Brady Statistic AS VS Percent: 0 %
Date Time Interrogation Session: 20200810113009
Implantable Lead Implant Date: 20111129
Implantable Lead Implant Date: 20111129
Implantable Lead Location: 753859
Implantable Lead Location: 753860
Implantable Lead Model: 5076
Implantable Lead Model: 5076
Implantable Pulse Generator Implant Date: 20111129
Lead Channel Impedance Value: 513 Ohm
Lead Channel Impedance Value: 967 Ohm
Lead Channel Pacing Threshold Amplitude: 0.5 V
Lead Channel Pacing Threshold Amplitude: 0.75 V
Lead Channel Pacing Threshold Pulse Width: 0.4 ms
Lead Channel Pacing Threshold Pulse Width: 0.4 ms
Lead Channel Sensing Intrinsic Amplitude: 2.8 mV
Lead Channel Setting Pacing Amplitude: 2 V
Lead Channel Setting Pacing Amplitude: 2.5 V
Lead Channel Setting Pacing Pulse Width: 0.4 ms
Lead Channel Setting Sensing Sensitivity: 4 mV

## 2018-10-02 NOTE — Progress Notes (Signed)
Pacemaker check in clinic. Normal device function. Thresholds, sensing, impedances consistent with previous measurements. Device programmed to maximize longevity. 4 AMS episodes, longest 30 minutes. 1 short run NSVT. Device programmed at appropriate safety margins. Histogram distribution appropriate for patient activity level. Device programmed to optimize intrinsic conduction. Estimated longevity 19 months. Patient to follow up with Dr Lovena Le in 6 months. Will likely need to consider accelerated follow up at that time.

## 2019-03-15 ENCOUNTER — Other Ambulatory Visit: Payer: Self-pay | Admitting: Interventional Cardiology

## 2019-03-15 DIAGNOSIS — I10 Essential (primary) hypertension: Secondary | ICD-10-CM

## 2019-03-15 MED ORDER — POTASSIUM CHLORIDE ER 10 MEQ PO CPCR: 10.0000 meq | ORAL_CAPSULE | Freq: Every day | ORAL | 0 refills | Status: DC

## 2019-03-15 MED ORDER — LOSARTAN POTASSIUM 50 MG PO TABS: 50.0000 mg | ORAL_TABLET | Freq: Every day | ORAL | 0 refills | Status: DC

## 2019-03-15 MED ORDER — CLOPIDOGREL BISULFATE 75 MG PO TABS: 75.0000 mg | ORAL_TABLET | Freq: Every day | ORAL | 0 refills | Status: DC

## 2019-03-15 MED ORDER — PRAVASTATIN SODIUM 20 MG PO TABS: 20.0000 mg | ORAL_TABLET | Freq: Every day | ORAL | 0 refills | Status: DC

## 2019-03-15 MED ORDER — METOPROLOL SUCCINATE ER 25 MG PO TB24: 25.0000 mg | ORAL_TABLET | Freq: Every day | ORAL | 0 refills | Status: DC

## 2019-03-15 MED ORDER — FUROSEMIDE 40 MG PO TABS: ORAL_TABLET | ORAL | 0 refills | Status: DC

## 2019-03-15 NOTE — Telephone Encounter (Signed)
Pt's medications were sent to pt's pharmacy as requested. Confirmation received.  

## 2019-03-29 NOTE — Progress Notes (Signed)
Cardiology Office Note   Date:  03/30/2019   ID:  [name] [name], DOB June 04, 1920, MRN [id number]  PCP:  Lavone Orn, MD    No chief complaint on file.  Coronary artery disease  Wt Readings from Last 3 Encounters:  03/30/19 150 lb 12.8 oz (68.4 kg)  04/07/18 146 lb (66.2 kg)  03/28/18 146 lb 9.6 oz (66.5 kg)       History of Present Illness: [name] [name] is a 86 y.o. male  with CAD, (LAD DES in 2005-06-10 pacemaker placement in 2009-06-10. Wife passed away in 11-Dec-2012.  He has chronically reported fatigue. DOE was treated with Lasix at varying doses based on BNP in the past.   In the past, Biggest trouble is runny nose and cough, likely related to allergies.  He has fallen a few times in the past.  Once was while he was getting out of a pickup truck.  He injured his leg.    Since the last visit, he continues to have some DOE.  He manages his activities and remains independent.   Denies : Chest pain. Dizziness. Leg edema. Nitroglycerin use. Orthopnea. Palpitations. Paroxysmal nocturnal dyspnea.  Syncope.  Not walking much these days. He has some dizziness.  No recent falls in the past few months.     Past Medical History:  Diagnosis Date  . Complete heart block (HCC)    Symptomatic  . Coronary artery disease   . GERD (gastroesophageal reflux disease)   . Hearing loss   . Hypertension   . Iliac artery aneurysm, right ([place])   . Prostate cancer ([place])   . SVT (supraventricular tachycardia) (HCC)     Past Surgical History:  Procedure Laterality Date  . CARDIAC CATHETERIZATION  06-10-2005  . CATARACT EXTRACTION     Right eye  . INGUINAL HERNIA REPAIR     Right  . INSERT / REPLACE / REMOVE PACEMAKER  01/20/10   dual-chamber permanent pacemaker implantation by Dr. Cristopher Peru  . KIDNEY STONE SURGERY     Left-sided  . ROTATOR CUFF REPAIR     Right     Current Outpatient Medications  Medication Sig Dispense Refill  . acetaminophen (TYLENOL) 500 MG tablet Take  500 mg by mouth every 6 (six) hours as needed (pain).     Marland Kitchen b complex vitamins tablet Take 1 tablet by mouth daily.      . clopidogrel (PLAVIX) 75 MG tablet Take 1 tablet (75 mg total) by mouth daily. 90 tablet 0  . docusate sodium (COLACE) 100 MG capsule Take 100 mg by mouth daily as needed for mild constipation.    . furosemide (LASIX) 40 MG tablet TAKE 1 TABLET EVERY OTHER DAY, ALTERNATING DOSE OF 1 TABLET TWICE A DAY 135 tablet 0  . losartan (COZAAR) 50 MG tablet Take 1 tablet (50 mg total) by mouth daily. 90 tablet 0  . metoprolol succinate (TOPROL-XL) 25 MG 24 hr tablet Take 1 tablet (25 mg total) by mouth daily. 90 tablet 0  . Multiple Vitamins-Minerals (CENTRUM SILVER) tablet Take 1 tablet by mouth daily.      . Multiple Vitamins-Minerals (ICAPS MV) TABS Take 1 tablet by mouth daily.      Marland Kitchen omeprazole (PRILOSEC) 40 MG capsule daily.    . potassium chloride (MICRO-K) 10 MEQ CR capsule Take 1 capsule (10 mEq total) by mouth daily. 90 capsule 0  . pravastatin (PRAVACHOL) 20 MG tablet Take 1 tablet (20 mg total) by mouth daily. 90 tablet  0   No current facility-administered medications for this visit.    Allergies:   Penicillins    Social History:  The patient  reports that he quit smoking about 56 years ago. His smoking use included cigarettes. He has never used smokeless tobacco. He reports that he does not drink alcohol or use drugs.   Family History:  The patient's family history includes Breast cancer in his mother; CVA in his father; Cancer in his sister; Hypertension in his father; Prostate cancer in his brother and brother; Stroke in his father.    ROS:  Please see the history of present illness.   Otherwise, review of systems are positive for dizziness.   All other systems are reviewed and negative.    PHYSICAL EXAM: VS:  BP 130/60   Pulse 69   Ht 5' 10.5" (1.791 m)   Wt 150 lb 12.8 oz (68.4 kg)   SpO2 92%   BMI 21.33 kg/m  , BMI Body mass index is 21.33 kg/m. GEN:  Well nourished, well developed, in no acute distress  HEENT: normal  Neck: no JVD, carotid bruits, or masses Cardiac: RRR; no murmurs, rubs, or gallops,no edema  Respiratory:  clear to auscultation bilaterally, normal work of breathing GI: soft, nontender, nondistended, + BS MS: no deformity or atrophy  Skin: warm and dry, no rash Neuro:  Strength and sensation are intact Psych: euthymic mood, full affect   EKG:   The ekg ordered today demonstrates A paced, V paced   Recent Labs: No results found for requested labs within last 8760 hours.   Lipid Panel No results found for: CHOL, TRIG, HDL, CHOLHDL, VLDL, LDLCALC, LDLDIRECT   Other studies Reviewed: Additional studies/ records that were reviewed today with results demonstrating: labs from Dr. Laurann Montana reviewed.   ASSESSMENT AND PLAN:  1. CAD/dyspnea on exertion: Taxus stent to mid LAD in 2007.  Continue clopidogrel monotherapy.  Avoid falling.  Sx controlled on  2. Peripheral arterial disease: Known iliac aneurysm.  It has been stable for many years and the patient preferred to not check this anymore. 3. Hyperlipidemia: Continue pravastatin.  LDL 68 in 12/2018.  4. Chronic diastolic heart failure: Appears euvolemic.  HTN well controlled with medicines.  5. Pacer: Checked with Dr. Lovena Le.   Current medicines are reviewed at length with the patient today.  The patient concerns regarding his medicines were addressed.  The following changes have been made:  No change  Labs/ tests ordered today include:  No orders of the defined types were placed in this encounter.   Recommend 150 minutes/week of aerobic exercise Low fat, low carb, high fiber diet recommended  Disposition:   FU in 1 year   Signed, Larae Grooms, MD  03/30/2019 1:46 PM    [facility] [address], [place], [place]  [address] Phone: [phone]; Fax: [phone]

## 2019-03-30 ENCOUNTER — Ambulatory Visit (INDEPENDENT_AMBULATORY_CARE_PROVIDER_SITE_OTHER): Payer: Medicare Other | Admitting: Interventional Cardiology

## 2019-03-30 ENCOUNTER — Encounter: Payer: Self-pay | Admitting: Interventional Cardiology

## 2019-03-30 ENCOUNTER — Other Ambulatory Visit: Payer: Self-pay

## 2019-03-30 VITALS — BP 130/60 | HR 69 | Ht 70.5 in | Wt 150.8 lb

## 2019-03-30 DIAGNOSIS — E782 Mixed hyperlipidemia: Secondary | ICD-10-CM | POA: Diagnosis not present

## 2019-03-30 DIAGNOSIS — I5032 Chronic diastolic (congestive) heart failure: Secondary | ICD-10-CM

## 2019-03-30 DIAGNOSIS — I10 Essential (primary) hypertension: Secondary | ICD-10-CM

## 2019-03-30 DIAGNOSIS — Z950 Presence of cardiac pacemaker: Secondary | ICD-10-CM | POA: Diagnosis not present

## 2019-03-30 DIAGNOSIS — I25118 Atherosclerotic heart disease of native coronary artery with other forms of angina pectoris: Secondary | ICD-10-CM | POA: Diagnosis not present

## 2019-03-30 NOTE — Patient Instructions (Signed)

## 2019-04-11 ENCOUNTER — Ambulatory Visit (INDEPENDENT_AMBULATORY_CARE_PROVIDER_SITE_OTHER): Payer: Medicare Other | Admitting: Internal Medicine

## 2019-04-11 ENCOUNTER — Encounter: Payer: Self-pay | Admitting: Internal Medicine

## 2019-04-11 ENCOUNTER — Other Ambulatory Visit: Payer: Self-pay

## 2019-04-11 VITALS — BP 116/68 | HR 86 | Ht 70.5 in | Wt 149.6 lb

## 2019-04-11 DIAGNOSIS — Z950 Presence of cardiac pacemaker: Secondary | ICD-10-CM

## 2019-04-11 DIAGNOSIS — I442 Atrioventricular block, complete: Secondary | ICD-10-CM | POA: Diagnosis not present

## 2019-04-11 DIAGNOSIS — I5032 Chronic diastolic (congestive) heart failure: Secondary | ICD-10-CM

## 2019-04-11 NOTE — Patient Instructions (Signed)
Medication Instructions:  Your physician recommends that you continue on your current medications as directed. Please refer to the Current Medication list given to you today.  Labwork: None ordered.  Testing/Procedures: None ordered.  Follow-Up:  Your physician wants you to follow-up in: 6 months with device clinic for a device check.  You will receive a reminder letter in the mail two months in advance. If you don't receive a letter, please call our office to schedule the follow-up appointment.  Your physician wants you to follow-up in: one year with Dr. Taylor.   You will receive a reminder letter in the mail two months in advance. If you don't receive a letter, please call our office to schedule the follow-up appointment.   Any Other Special Instructions Will Be Listed Below (If Applicable).  If you need a refill on your cardiac medications before your next appointment, please call your pharmacy.   

## 2019-04-11 NOTE — Progress Notes (Signed)
HPI Mr. Ginnetti returns today for followup. He is a pleasant 86 yo man with a h/o CHB, s/p PPM insertion, CAD, peripheral vascular disease, and iliac artery aneurysm. He denies chest pain or sob. No syncope. No edema.  Allergies  Allergen Reactions  . Penicillins Anaphylaxis     Current Outpatient Medications  Medication Sig Dispense Refill  . acetaminophen (TYLENOL) 500 MG tablet Take 500 mg by mouth every 6 (six) hours as needed (pain).     Marland Kitchen b complex vitamins tablet Take 1 tablet by mouth daily.      . clopidogrel (PLAVIX) 75 MG tablet Take 1 tablet (75 mg total) by mouth daily. 90 tablet 0  . docusate sodium (COLACE) 100 MG capsule Take 100 mg by mouth daily as needed for mild constipation.    . furosemide (LASIX) 40 MG tablet TAKE 1 TABLET EVERY OTHER DAY, ALTERNATING DOSE OF 1 TABLET TWICE A DAY 135 tablet 0  . losartan (COZAAR) 50 MG tablet Take 1 tablet (50 mg total) by mouth daily. 90 tablet 0  . metoprolol succinate (TOPROL-XL) 25 MG 24 hr tablet Take 1 tablet (25 mg total) by mouth daily. 90 tablet 0  . Multiple Vitamins-Minerals (CENTRUM SILVER) tablet Take 1 tablet by mouth daily.      . Multiple Vitamins-Minerals (ICAPS MV) TABS Take 1 tablet by mouth daily.      Marland Kitchen omeprazole (PRILOSEC) 40 MG capsule daily.    . potassium chloride (MICRO-K) 10 MEQ CR capsule Take 1 capsule (10 mEq total) by mouth daily. 90 capsule 0  . pravastatin (PRAVACHOL) 20 MG tablet Take 1 tablet (20 mg total) by mouth daily. 90 tablet 0   No current facility-administered medications for this visit.     Past Medical History:  Diagnosis Date  . Complete heart block (HCC)    Symptomatic  . Coronary artery disease   . GERD (gastroesophageal reflux disease)   . Hearing loss   . Hypertension   . Iliac artery aneurysm, right ([place])   . Prostate cancer ([place])   . SVT (supraventricular tachycardia) (HCC)     ROS:   All systems reviewed and negative except as noted in the HPI.   Past  Surgical History:  Procedure Laterality Date  . CARDIAC CATHETERIZATION  2007  . CATARACT EXTRACTION     Right eye  . INGUINAL HERNIA REPAIR     Right  . INSERT / REPLACE / REMOVE PACEMAKER  01/20/10   dual-chamber permanent pacemaker implantation by Dr. Cristopher Peru  . KIDNEY STONE SURGERY     Left-sided  . ROTATOR CUFF REPAIR     Right     Family History  Problem Relation Age of Onset  . Breast cancer Mother   . Hypertension Father   . CVA Father   . Stroke Father   . Prostate cancer Brother   . Prostate cancer Brother   . Cancer Sister        esophageal  . Heart attack Neg Hx      Social History   Socioeconomic History  . Marital status: Widowed    Spouse name: Not on file  . Number of children: Not on file  . Years of education: Not on file  . Highest education level: Not on file  Occupational History  . Occupation: Retired    Fish farm manager: RETIRED  Tobacco Use  . Smoking status: Former Smoker    Types: Cigarettes    Quit date: 08/09/1962  Years since quitting: 56.7  . Smokeless tobacco: Never Used  . Tobacco comment: Quit smoking many years ago.  Substance and Sexual Activity  . Alcohol use: No  . Drug use: No  . Sexual activity: Not on file  Other Topics Concern  . Not on file  Social History Narrative  . Not on file   Social Determinants of Health   Financial Resource Strain:   . Difficulty of Paying Living Expenses: Not on file  Food Insecurity:   . Worried About Charity fundraiser in the Last Year: Not on file  . Ran Out of Food in the Last Year: Not on file  Transportation Needs:   . Lack of Transportation (Medical): Not on file  . Lack of Transportation (Non-Medical): Not on file  Physical Activity:   . Days of Exercise per Week: Not on file  . Minutes of Exercise per Session: Not on file  Stress:   . Feeling of Stress : Not on file  Social Connections:   . Frequency of Communication with Friends and Family: Not on file  . Frequency  of Social Gatherings with Friends and Family: Not on file  . Attends Religious Services: Not on file  . Active Member of Clubs or Organizations: Not on file  . Attends Archivist Meetings: Not on file  . Marital Status: Not on file  Intimate Partner Violence:   . Fear of Current or Ex-Partner: Not on file  . Emotionally Abused: Not on file  . Physically Abused: Not on file  . Sexually Abused: Not on file     BP 116/68   Pulse 86   Ht 5' 10.5" (1.791 m)   Wt 149 lb 9.6 oz (67.9 kg)   SpO2 99%   BMI 21.16 kg/m   Physical Exam:  Well appearing NAD HEENT: Unremarkable Neck:  No JVD, no thyromegally Lymphatics:  No adenopathy Back:  No CVA tenderness Lungs:  Clear with no wheezes HEART:  Regular rate rhythm, no murmurs, no rubs, no clicks Abd:  soft, positive bowel sounds, no organomegally, no rebound, no guarding Ext:  2 plus pulses, no edema, no cyanosis, no clubbing Skin:  No rashes no nodules Neuro:  CN II through XII intact, motor grossly intact  DEVICE  Normal device function.  See PaceArt for details.   Assess/Plan: 1. CHB - he is asymptomatic, s/p PPM insertion. 2. Diastolic heart failure - his symptoms remain class 2 on medical therapy. He has no evidence of volume overload today. 3. HTN - his bp is well controlled.  4. Dyslipidemia - he remains on pravachol. He denies muscle aches or problems.  Mikle Bosworth.D.

## 2019-05-25 ENCOUNTER — Other Ambulatory Visit: Payer: Self-pay | Admitting: Interventional Cardiology

## 2019-05-25 DIAGNOSIS — I10 Essential (primary) hypertension: Secondary | ICD-10-CM

## 2019-06-07 ENCOUNTER — Other Ambulatory Visit: Payer: Self-pay | Admitting: Interventional Cardiology

## 2019-08-19 ENCOUNTER — Other Ambulatory Visit: Payer: Self-pay | Admitting: Interventional Cardiology

## 2019-10-09 ENCOUNTER — Ambulatory Visit: Payer: Medicare Other | Admitting: *Deleted

## 2019-10-09 DIAGNOSIS — I442 Atrioventricular block, complete: Secondary | ICD-10-CM

## 2019-10-10 NOTE — Progress Notes (Signed)
Remote pacemaker transmission.   

## 2019-11-08 ENCOUNTER — Other Ambulatory Visit: Payer: Self-pay

## 2019-11-08 ENCOUNTER — Ambulatory Visit (INDEPENDENT_AMBULATORY_CARE_PROVIDER_SITE_OTHER): Payer: Medicare Other | Admitting: Emergency Medicine

## 2019-11-08 DIAGNOSIS — I442 Atrioventricular block, complete: Secondary | ICD-10-CM | POA: Diagnosis not present

## 2019-11-08 DIAGNOSIS — Z950 Presence of cardiac pacemaker: Secondary | ICD-10-CM

## 2019-11-20 NOTE — Progress Notes (Signed)
Pacemaker check in clinic. Normal device function. Thresholds, sensing, impedances consistent with previous measurements. Device programmed to maximize longevity.2 AMS episodes , AT/AF with longest episode lasting 2 minutes , 12 seconds No high ventricular rates noted. Device programmed at appropriate safety margins. Histogram distribution appropriate for patient activity level. Device programmed to optimize intrinsic conduction. Patient not enrolled in remote follow-up  Patient education completed. Scheduled for in clinic device check 05/08/20. Error in data collection.

## 2020-01-29 ENCOUNTER — Ambulatory Visit
Admission: RE | Admit: 2020-01-29 | Discharge: 2020-01-29 | Disposition: A | Discharge status: Home / Self Care | Payer: Medicare Other | Source: Ambulatory Visit | Attending: Internal Medicine | Admitting: Internal Medicine

## 2020-01-29 ENCOUNTER — Other Ambulatory Visit: Payer: Self-pay | Admitting: Internal Medicine

## 2020-01-29 DIAGNOSIS — M25512 Pain in left shoulder: Secondary | ICD-10-CM

## 2020-02-26 ENCOUNTER — Telehealth: Payer: Self-pay | Admitting: Internal Medicine

## 2020-02-26 NOTE — Telephone Encounter (Signed)
    Pt is calling, he wanted to know if Dr. Ladona Ridgel would like to see him before his appt on 05/08/20. He also wanted to know if the appt on 03/17 is to check his pacemaker battery and if he can get a sooner appt.

## 2020-02-26 NOTE — Telephone Encounter (Signed)
LMOM to call Device Clinic, # provided. Need to schedule device clinic appointment for battery check. Will need monthly battery checks.

## 2020-03-05 NOTE — Telephone Encounter (Signed)
Spoke to patient in regards to monthly battery check. Requested we call him back at a later time to schedule. He is not able to schedule an apt. During this phone call.

## 2020-03-07 NOTE — Telephone Encounter (Signed)
Follow Up:    Pt says he is returning your call. 

## 2020-03-10 NOTE — Telephone Encounter (Signed)
Returning patients phone call.  No answer, LMOVM.  

## 2020-03-11 NOTE — Telephone Encounter (Signed)
Called patient to set up in-clinic monthly battery check. States he was sleeping. Will attempt to get back in touch with patient at a later time.

## 2020-03-21 NOTE — Telephone Encounter (Signed)
Called to manu in-clinic apt. No answer, LMOVM.

## 2020-03-27 ENCOUNTER — Ambulatory Visit (INDEPENDENT_AMBULATORY_CARE_PROVIDER_SITE_OTHER): Payer: Medicare Other | Admitting: Emergency Medicine

## 2020-03-27 ENCOUNTER — Other Ambulatory Visit: Payer: Self-pay

## 2020-03-27 DIAGNOSIS — I442 Atrioventricular block, complete: Secondary | ICD-10-CM | POA: Diagnosis not present

## 2020-03-27 LAB — CUP PACEART INCLINIC DEVICE CHECK
Date Time Interrogation Session: 20220203145344
Implantable Lead Implant Date: 20111129
Implantable Lead Implant Date: 20111129
Implantable Lead Location: 753859
Implantable Lead Location: 753860
Implantable Lead Model: 5076
Implantable Lead Model: 5076
Implantable Pulse Generator Implant Date: 20111129

## 2020-03-27 NOTE — Progress Notes (Signed)
Battery check in-clinic. Reached ERI 02/10/20, now VVI 65. No other testing performed due to battery status. Dr Lovena Le in to see patient and gen change scheduled by Bethann Humble RN. Patient given preop instructions by Erskine Emery.

## 2020-03-28 ENCOUNTER — Telehealth: Payer: Self-pay

## 2020-03-28 LAB — CBC WITH DIFFERENTIAL/PLATELET
Basophils Absolute: 0 10*3/uL (ref 0.0–0.2)
Basos: 1 %
EOS (ABSOLUTE): 0.1 10*3/uL (ref 0.0–0.4)
Eos: 2 %
Hematocrit: 44.6 % (ref 37.5–51.0)
Hemoglobin: 15.6 g/dL (ref 13.0–17.7)
Immature Grans (Abs): 0 10*3/uL (ref 0.0–0.1)
Immature Granulocytes: 0 %
Lymphocytes Absolute: 2 10*3/uL (ref 0.7–3.1)
Lymphs: 32 %
MCH: 33.5 pg — ABNORMAL HIGH (ref 26.6–33.0)
MCHC: 35 g/dL (ref 31.5–35.7)
MCV: 96 fL (ref 79–97)
Monocytes Absolute: 0.8 10*3/uL (ref 0.1–0.9)
Monocytes: 12 %
Neutrophils Absolute: 3.5 10*3/uL (ref 1.4–7.0)
Neutrophils: 53 %
Platelets: 164 10*3/uL (ref 150–450)
RBC: 4.66 x10E6/uL (ref 4.14–5.80)
RDW: 12.4 % (ref 11.6–15.4)
WBC: 6.5 10*3/uL (ref 3.4–10.8)

## 2020-03-28 LAB — BASIC METABOLIC PANEL
BUN/Creatinine Ratio: 17 (ref 10–24)
BUN: 26 mg/dL (ref 10–36)
CO2: 27 mmol/L (ref 20–29)
Calcium: 8.8 mg/dL (ref 8.6–10.2)
Chloride: 101 mmol/L (ref 96–106)
Creatinine, Ser: 1.54 mg/dL — ABNORMAL HIGH (ref 0.76–1.27)
GFR calc Af Amer: 43 mL/min/{1.73_m2} — ABNORMAL LOW (ref >59)
GFR calc non Af Amer: 37 mL/min/{1.73_m2} — ABNORMAL LOW (ref >59)
Glucose: 84 mg/dL (ref 65–99)
Potassium: 3.6 mmol/L (ref 3.5–5.2)
Sodium: 142 mmol/L (ref 134–144)

## 2020-03-28 NOTE — Telephone Encounter (Signed)
Patient had battery check completed in device clinic 03/27/20. Patient is scheduled for gen change on 04/01/20 w/ Dr. Lovena Le.

## 2020-03-29 ENCOUNTER — Other Ambulatory Visit (HOSPITAL_COMMUNITY)
Admission: RE | Admit: 2020-03-29 | Discharge: 2020-03-29 | Disposition: A | Discharge status: Home / Self Care | Payer: Medicare Other | Source: Ambulatory Visit | Attending: Internal Medicine | Admitting: Internal Medicine

## 2020-03-29 DIAGNOSIS — Z20822 Contact with and (suspected) exposure to covid-19: Secondary | ICD-10-CM | POA: Diagnosis not present

## 2020-03-29 DIAGNOSIS — Z01812 Encounter for preprocedural laboratory examination: Secondary | ICD-10-CM | POA: Diagnosis present

## 2020-03-29 LAB — SARS CORONAVIRUS 2 (TAT 6-24 HRS): SARS Coronavirus 2: NEGATIVE

## 2020-03-31 NOTE — Progress Notes (Signed)
Instructed patient on the following items: Arrival time 0730 Nothing to eat or drink after midnight No meds AM of procedure Responsible person to drive you home and stay with you for 24 hrs Wash with special soap night before and morning of procedure Last dose of Plavix 03/29/20

## 2020-04-01 ENCOUNTER — Other Ambulatory Visit: Payer: Self-pay

## 2020-04-01 ENCOUNTER — Encounter (HOSPITAL_COMMUNITY): Payer: Self-pay | Admitting: Internal Medicine

## 2020-04-01 ENCOUNTER — Encounter (HOSPITAL_COMMUNITY)
Admission: RE | Disposition: A | Discharge status: Home / Self Care | Payer: Self-pay | Source: Home / Self Care | Attending: Internal Medicine

## 2020-04-01 ENCOUNTER — Ambulatory Visit (HOSPITAL_COMMUNITY)
Admission: RE | Admit: 2020-04-01 | Discharge: 2020-04-01 | Disposition: A | Discharge status: Home / Self Care | Payer: Medicare Other | Attending: Internal Medicine | Admitting: Internal Medicine

## 2020-04-01 DIAGNOSIS — Z7902 Long term (current) use of antithrombotics/antiplatelets: Secondary | ICD-10-CM | POA: Diagnosis not present

## 2020-04-01 DIAGNOSIS — Z880 Allergy status to penicillin: Secondary | ICD-10-CM | POA: Insufficient documentation

## 2020-04-01 DIAGNOSIS — I5030 Unspecified diastolic (congestive) heart failure: Secondary | ICD-10-CM | POA: Insufficient documentation

## 2020-04-01 DIAGNOSIS — Z79899 Other long term (current) drug therapy: Secondary | ICD-10-CM | POA: Insufficient documentation

## 2020-04-01 DIAGNOSIS — Z87891 Personal history of nicotine dependence: Secondary | ICD-10-CM | POA: Insufficient documentation

## 2020-04-01 DIAGNOSIS — Z45010 Encounter for checking and testing of cardiac pacemaker pulse generator [battery]: Secondary | ICD-10-CM | POA: Insufficient documentation

## 2020-04-01 DIAGNOSIS — E785 Hyperlipidemia, unspecified: Secondary | ICD-10-CM | POA: Diagnosis not present

## 2020-04-01 DIAGNOSIS — I110 Hypertensive heart disease with heart failure: Secondary | ICD-10-CM | POA: Insufficient documentation

## 2020-04-01 DIAGNOSIS — I442 Atrioventricular block, complete: Secondary | ICD-10-CM | POA: Diagnosis not present

## 2020-04-01 HISTORY — PX: PPM GENERATOR CHANGEOUT: EP1233

## 2020-04-01 SURGERY — PPM GENERATOR CHANGEOUT

## 2020-04-01 MED ORDER — LIDOCAINE HCL (PF) 1 % IJ SOLN
INTRAMUSCULAR | Status: DC | PRN
  Administered 2020-04-01: 60 mL

## 2020-04-01 MED ORDER — POVIDONE-IODINE 10 % EX SWAB: 2.0000 "application " | Freq: Once | CUTANEOUS | Status: DC

## 2020-04-01 MED ORDER — SODIUM CHLORIDE 0.9 % IV SOLN: INTRAVENOUS | Status: DC

## 2020-04-01 MED ORDER — CHLORHEXIDINE GLUCONATE 4 % EX LIQD
4.0000 "application " | Freq: Once | CUTANEOUS | Status: DC
  Filled 2020-04-01: qty 60

## 2020-04-01 MED ORDER — SODIUM CHLORIDE 0.9 % IV SOLN
INTRAVENOUS | Status: AC
  Filled 2020-04-01: qty 2

## 2020-04-01 MED ORDER — ONDANSETRON HCL 4 MG/2ML IJ SOLN: 4.0000 mg | Freq: Four times a day (QID) | INTRAMUSCULAR | Status: DC | PRN

## 2020-04-01 MED ORDER — VANCOMYCIN HCL IN DEXTROSE 1-5 GM/200ML-% IV SOLN
1000.0000 mg | INTRAVENOUS | Status: AC
  Administered 2020-04-01: 1000 mg via INTRAVENOUS

## 2020-04-01 MED ORDER — SODIUM CHLORIDE 0.9 % IV SOLN
80.0000 mg | INTRAVENOUS | Status: AC
  Administered 2020-04-01: 80 mg
  Filled 2020-04-01: qty 2

## 2020-04-01 MED ORDER — LIDOCAINE HCL (PF) 1 % IJ SOLN
INTRAMUSCULAR | Status: AC
  Filled 2020-04-01: qty 60

## 2020-04-01 MED ORDER — ACETAMINOPHEN 325 MG PO TABS
325.0000 mg | ORAL_TABLET | ORAL | Status: DC | PRN
  Filled 2020-04-01: qty 2

## 2020-04-01 MED ORDER — VANCOMYCIN HCL IN DEXTROSE 1-5 GM/200ML-% IV SOLN
INTRAVENOUS | Status: AC
  Filled 2020-04-01: qty 200

## 2020-04-01 SURGICAL SUPPLY — 4 items
CABLE SURGICAL S-101-97-12 (CABLE) ×2 IMPLANT
PACEMAKER ATTESTA ATDRL1 (Pacemaker) ×2 IMPLANT
PAD PRO RADIOLUCENT 2001M-C (PAD) ×2 IMPLANT
TRAY PACEMAKER INSERTION (PACKS) ×2 IMPLANT

## 2020-04-01 NOTE — Discharge Instructions (Signed)
Supplemental Discharge Instructions for  Pacemaker/Defibrillator Patients  Activity Do not raise your left/right arm above shoulder level or extend it backward beyond shoulder level for 2 weeks. Wear the arm sling as a reminder or as needed for comfort for 2 weeks. No heavy lifting or vigorous activity with your left/right arm for 6-8 weeks.    NO DRIVING is preferable for 2 weeks; If absolutely necessary, drive only short, familiar routes. DO wear your seatbelt, even if it crosses over the pacemaker site.  WOUND CARE - Keep the wound area clean and dry.  Remove the dressing the day after you return home (usually 48 hours after the procedure). - DO NOT SUBMERGE UNDER WATER UNTIL FULLY HEALED (no tub baths, hot tubs, swimming pools, etc.).  - You  may shower or take a sponge bath after the dressing is removed. DO NOT SOAK the area and do not allow the shower to directly spray on the site. - If you have tape/steri-strips on your wound, these will fall off; do not pull them off prematurely.   - No bandage is needed on the site.  DO  NOT apply any creams, oils, or ointments to the wound area. - If you notice any drainage or discharge from the wound, any swelling, excessive redness or bruising at the site, or if you develop a fever > 101? F after you are discharged home, call the office at once.  Special Instructions - You are still able to use cellular telephones.  Avoid carrying your cellular phone near your device. - When traveling through airports, show security personnel your identification card to avoid being screened in the metal detectors.  - Avoid arc welding equipment, MRI testing (magnetic resonance imaging), TENS units (transcutaneous nerve stimulators).  Call the office for questions about other devices. - Avoid electrical appliances that are in poor condition or are not properly grounded. - Microwave ovens are safe to be near or to operate.  Additional information for defibrillator  patients should your device go off: - If your device goes off ONCE and you feel fine afterward, notify the clinic at [phone]. - If your device goes off ONCE and you do not feel well afterward, call 911. - If your device goes off TWICE or more in one day, call 911.  DO NOT DRIVE YOURSELF OR A FAMILY MEMBER WITH A DEFIBRILLATOR TO THE HOSPITAL--CALL 911.

## 2020-04-01 NOTE — H&P (Signed)
HPI Mr. Granquist returns today for followup. He is a pleasant 87 yo man with a h/o CHB, s/p PPM insertion, CAD, peripheral vascular disease, and iliac artery aneurysm. He denies chest pain or sob. No syncope. No edema.      Allergies  Allergen Reactions  . Penicillins Anaphylaxis           Current Outpatient Medications  Medication Sig Dispense Refill  . acetaminophen (TYLENOL) 500 MG tablet Take 500 mg by mouth every 6 (six) hours as needed (pain).     Marland Kitchen b complex vitamins tablet Take 1 tablet by mouth daily.      . clopidogrel (PLAVIX) 75 MG tablet Take 1 tablet (75 mg total) by mouth daily. 90 tablet 0  . docusate sodium (COLACE) 100 MG capsule Take 100 mg by mouth daily as needed for mild constipation.    . furosemide (LASIX) 40 MG tablet TAKE 1 TABLET EVERY OTHER DAY, ALTERNATING DOSE OF 1 TABLET TWICE A DAY 135 tablet 0  . losartan (COZAAR) 50 MG tablet Take 1 tablet (50 mg total) by mouth daily. 90 tablet 0  . metoprolol succinate (TOPROL-XL) 25 MG 24 hr tablet Take 1 tablet (25 mg total) by mouth daily. 90 tablet 0  . Multiple Vitamins-Minerals (CENTRUM SILVER) tablet Take 1 tablet by mouth daily.      . Multiple Vitamins-Minerals (ICAPS MV) TABS Take 1 tablet by mouth daily.      Marland Kitchen omeprazole (PRILOSEC) 40 MG capsule daily.    . potassium chloride (MICRO-K) 10 MEQ CR capsule Take 1 capsule (10 mEq total) by mouth daily. 90 capsule 0  . pravastatin (PRAVACHOL) 20 MG tablet Take 1 tablet (20 mg total) by mouth daily. 90 tablet 0   No current facility-administered medications for this visit.         Past Medical History:  Diagnosis Date  . Complete heart block (HCC)    Symptomatic  . Coronary artery disease   . GERD (gastroesophageal reflux disease)   . Hearing loss   . Hypertension   . Iliac artery aneurysm, right ([place])   . Prostate cancer ([place])   . SVT (supraventricular tachycardia) (HCC)     ROS:   All systems reviewed and negative  except as noted in the HPI.   Past Surgical History:  Procedure Laterality Date  . CARDIAC CATHETERIZATION  2007  . CATARACT EXTRACTION     Right eye  . INGUINAL HERNIA REPAIR     Right  . INSERT / REPLACE / REMOVE PACEMAKER  01/20/10   dual-chamber permanent pacemaker implantation by Dr. Cristopher Peru  . KIDNEY STONE SURGERY     Left-sided  . ROTATOR CUFF REPAIR     Right          Family History  Problem Relation Age of Onset  . Breast cancer Mother   . Hypertension Father   . CVA Father   . Stroke Father   . Prostate cancer Brother   . Prostate cancer Brother   . Cancer Sister        esophageal  . Heart attack Neg Hx      Social History        Socioeconomic History  . Marital status: Widowed    Spouse name: Not on file  . Number of children: Not on file  . Years of education: Not on file  . Highest education level: Not on file  Occupational History  . Occupation: Retired    Fish farm manager: RETIRED  Tobacco Use  . Smoking status: Former Smoker    Types: Cigarettes    Quit date: 08/09/1962    Years since quitting: 56.7  . Smokeless tobacco: Never Used  . Tobacco comment: Quit smoking many years ago.  Substance and Sexual Activity  . Alcohol use: No  . Drug use: No  . Sexual activity: Not on file  Other Topics Concern  . Not on file  Social History Narrative  . Not on file   Social Determinants of Health      Financial Resource Strain:   . Difficulty of Paying Living Expenses: Not on file  Food Insecurity:   . Worried About Charity fundraiser in the Last Year: Not on file  . Ran Out of Food in the Last Year: Not on file  Transportation Needs:   . Lack of Transportation (Medical): Not on file  . Lack of Transportation (Non-Medical): Not on file  Physical Activity:   . Days of Exercise per Week: Not on file  . Minutes of Exercise per Session: Not on file  Stress:   . Feeling of Stress : Not on file   Social Connections:   . Frequency of Communication with Friends and Family: Not on file  . Frequency of Social Gatherings with Friends and Family: Not on file  . Attends Religious Services: Not on file  . Active Member of Clubs or Organizations: Not on file  . Attends Archivist Meetings: Not on file  . Marital Status: Not on file  Intimate Partner Violence:   . Fear of Current or Ex-Partner: Not on file  . Emotionally Abused: Not on file  . Physically Abused: Not on file  . Sexually Abused: Not on file     BP 116/68   Pulse 86   Ht 5' 10.5" (1.791 m)   Wt 149 lb 9.6 oz (67.9 kg)   SpO2 99%   BMI 21.16 kg/m   Physical Exam:  Well appearing NAD HEENT: Unremarkable Neck:  No JVD, no thyromegally Lymphatics:  No adenopathy Back:  No CVA tenderness Lungs:  Clear with no wheezes HEART:  Regular rate rhythm, no murmurs, no rubs, no clicks Abd:  soft, positive bowel sounds, no organomegally, no rebound, no guarding Ext:  2 plus pulses, no edema, no cyanosis, no clubbing Skin:  No rashes no nodules Neuro:  CN II through XII intact, motor grossly intact  DEVICE  Normal device function.  See PaceArt for details.   Assess/Plan: 1. CHB - he is asymptomatic, s/p PPM insertion. 2. Diastolic heart failure - his symptoms remain class 2 on medical therapy. He has no evidence of volume overload today. 3. HTN - his bp is well controlled.  4. Dyslipidemia - he remains on pravachol. He denies muscle aches or problems.  Ponciano Ort.  EP Attending  Patient seen and examined. Since prior clinic visit, no change.  We will plan PPM gen change out as he is at [facility].  Carleene Overlie Isatu Macinnes,MD

## 2020-04-15 ENCOUNTER — Ambulatory Visit (INDEPENDENT_AMBULATORY_CARE_PROVIDER_SITE_OTHER): Payer: Medicare Other | Admitting: Emergency Medicine

## 2020-04-15 ENCOUNTER — Other Ambulatory Visit: Payer: Self-pay

## 2020-04-15 DIAGNOSIS — Z950 Presence of cardiac pacemaker: Secondary | ICD-10-CM | POA: Diagnosis not present

## 2020-04-15 DIAGNOSIS — I442 Atrioventricular block, complete: Secondary | ICD-10-CM

## 2020-04-15 LAB — CUP PACEART INCLINIC DEVICE CHECK
Battery Impedance: 100 Ohm
Battery Remaining Longevity: 133 mo
Battery Voltage: 2.79 V
Brady Statistic AP VP Percent: 25 %
Brady Statistic AP VS Percent: 0 %
Brady Statistic AS VP Percent: 72 %
Brady Statistic AS VS Percent: 3 %
Date Time Interrogation Session: 20220222111900
Implantable Lead Implant Date: 20111129
Implantable Lead Implant Date: 20111129
Implantable Lead Location: 753859
Implantable Lead Location: 753860
Implantable Lead Model: 5076
Implantable Lead Model: 5076
Implantable Pulse Generator Implant Date: 20220208
Lead Channel Impedance Value: 486 Ohm
Lead Channel Impedance Value: 959 Ohm
Lead Channel Pacing Threshold Amplitude: 0.75 V
Lead Channel Pacing Threshold Amplitude: 0.75 V
Lead Channel Pacing Threshold Pulse Width: 0.4 ms
Lead Channel Pacing Threshold Pulse Width: 0.4 ms
Lead Channel Sensing Intrinsic Amplitude: 4 mV
Lead Channel Setting Pacing Amplitude: 2 V
Lead Channel Setting Pacing Amplitude: 2.5 V
Lead Channel Setting Pacing Pulse Width: 0.76 ms
Lead Channel Setting Sensing Sensitivity: 2.8 mV

## 2020-04-15 NOTE — Patient Instructions (Signed)
Plug in your home monitor 3-6 feet from where you sleep and send a transmission when you return home. Call the device clinic for help at [phone].

## 2020-04-15 NOTE — Progress Notes (Signed)
Wound check appointment. Steri-strips removed. Wound without redness or edema. Incision edges approximated, wound well healed. Normal device function. Thresholds, sensing, and impedances consistent with implant measurements. Device programmed at chronic settings due to mature leads.  Histogram distribution appropriate for patient and level of activity. No mode switches or high ventricular rates noted. Patient educated about wound care, arm mobility, lifting restrictions. Patient to begin home remotes  and next remote scheduled for 07/15/20. Follow-up with Dr Lovena Le 07/01/20.

## 2020-05-22 ENCOUNTER — Other Ambulatory Visit: Payer: Self-pay | Admitting: Interventional Cardiology

## 2020-06-09 ENCOUNTER — Other Ambulatory Visit: Payer: Self-pay | Admitting: Interventional Cardiology

## 2020-06-22 ENCOUNTER — Other Ambulatory Visit: Payer: Self-pay | Admitting: Interventional Cardiology

## 2020-07-01 ENCOUNTER — Other Ambulatory Visit: Payer: Self-pay

## 2020-07-01 ENCOUNTER — Encounter: Payer: Self-pay | Admitting: Internal Medicine

## 2020-07-01 ENCOUNTER — Ambulatory Visit (INDEPENDENT_AMBULATORY_CARE_PROVIDER_SITE_OTHER): Payer: Medicare Other | Admitting: Internal Medicine

## 2020-07-01 VITALS — BP 96/56 | HR 82 | Ht 70.5 in | Wt 146.6 lb

## 2020-07-01 DIAGNOSIS — Z950 Presence of cardiac pacemaker: Secondary | ICD-10-CM | POA: Diagnosis not present

## 2020-07-01 DIAGNOSIS — I5032 Chronic diastolic (congestive) heart failure: Secondary | ICD-10-CM

## 2020-07-01 DIAGNOSIS — I442 Atrioventricular block, complete: Secondary | ICD-10-CM

## 2020-07-01 NOTE — Patient Instructions (Signed)
Medication Instructions:  Your physician recommends that you continue on your current medications as directed. Please refer to the Current Medication list given to you today.  Labwork: None ordered.  Testing/Procedures: None ordered.  Follow-Up: Your physician wants you to follow-up in: one year with Cristopher Peru, MD or one of the following Advanced Practice Providers on your designated Care Team:    Chanetta Marshall, NP  Tommye Standard, PA-C  Legrand Como "Jonni Sanger" [place], [place]  Remote monitoring is used to monitor your Pacemaker from home. This monitoring reduces the number of office visits required to check your device to one time per year. It allows [place] to keep an eye on the functioning of your device to ensure it is working properly. You are scheduled for a device check from home on 07/15/2020. You may send your transmission at any time that day. If you have a wireless device, the transmission will be sent automatically. After your physician reviews your transmission, you will receive a postcard with your next transmission date.  Any Other Special Instructions Will Be Listed Below (If Applicable).  If you need a refill on your cardiac medications before your next appointment, please call your pharmacy.

## 2020-07-01 NOTE — Progress Notes (Signed)
HPI [name] returns today for followup. He is a pleasant 87 yo man with a h/o CHB, s/p PPM insertion, CAD, peripheral vascular disease, and iliac artery aneurysm. He denies chest pain or sob. No syncope. No edema. He underwent PPM gen change out 3 months ago and has done well. He notes class 2 dyspnea. He admits to sodium indiscretion. He is taking lasix daily. Allergies  Allergen Reactions  . Penicillins Anaphylaxis     Current Outpatient Medications  Medication Sig Dispense Refill  . acetaminophen (TYLENOL) 500 MG tablet Take 500 mg by mouth every 6 (six) hours as needed (pain).     Marland Kitchen b complex vitamins tablet Take 1 tablet by mouth daily.    . clopidogrel (PLAVIX) 75 MG tablet TAKE 1 TABLET BY MOUTH EVERY DAY 30 tablet 0  . docusate sodium (COLACE) 100 MG capsule Take 100 mg by mouth daily as needed for mild constipation.    . furosemide (LASIX) 40 MG tablet TAKE 1 TABLET EVERY OTHER DAY, ALTERNATING DOSE OF 1 TABLET TWICE A DAY 45 tablet 0  . losartan (COZAAR) 50 MG tablet TAKE 1 TABLET BY MOUTH EVERY DAY 90 tablet 3  . metoprolol succinate (TOPROL-XL) 25 MG 24 hr tablet TAKE 1 TABLET BY MOUTH EVERY DAY 90 tablet 2  . Multiple Vitamins-Minerals (CENTRUM SILVER) tablet Take 1 tablet by mouth daily.    . Multiple Vitamins-Minerals (ICAPS MV) TABS Take 1 tablet by mouth daily.    Marland Kitchen omeprazole (PRILOSEC) 40 MG capsule Take 40 mg by mouth daily.    . potassium chloride (MICRO-K) 10 MEQ CR capsule TAKE 1 CAPSULE BY MOUTH EVERY DAY 90 capsule 3  . pravastatin (PRAVACHOL) 20 MG tablet TAKE 1 TABLET BY MOUTH EVERY DAY 30 tablet 0   No current facility-administered medications for this visit.     Past Medical History:  Diagnosis Date  . Complete heart block (HCC)    Symptomatic  . Coronary artery disease   . GERD (gastroesophageal reflux disease)   . Hearing loss   . Hypertension   . Iliac artery aneurysm, right ([place])   . Prostate cancer ([place])   . SVT (supraventricular  tachycardia) (HCC)     ROS:   All systems reviewed and negative except as noted in the HPI.   Past Surgical History:  Procedure Laterality Date  . CARDIAC CATHETERIZATION  2007  . CATARACT EXTRACTION     Right eye  . INGUINAL HERNIA REPAIR     Right  . INSERT / REPLACE / REMOVE PACEMAKER  01/20/10   dual-chamber permanent pacemaker implantation by Dr. Cristopher Peru  . KIDNEY STONE SURGERY     Left-sided  . PPM GENERATOR CHANGEOUT N/A 04/01/2020   Procedure: PPM GENERATOR CHANGEOUT;  Surgeon: Evans Lance, MD;  Location: [facility];  Service: Cardiovascular;  Laterality: N/A;  . ROTATOR CUFF REPAIR     Right     Family History  Problem Relation Age of Onset  . Breast cancer Mother   . Hypertension Father   . CVA Father   . Stroke Father   . Prostate cancer Brother   . Prostate cancer Brother   . Cancer Sister        esophageal  . Heart attack Neg Hx      Social History   Socioeconomic History  . Marital status: Widowed    Spouse name: Not on file  . Number of children: Not on file  .  Years of education: Not on file  . Highest education level: Not on file  Occupational History  . Occupation: Retired    Fish farm manager: RETIRED  Tobacco Use  . Smoking status: Former Smoker    Types: Cigarettes    Quit date: 08/09/1962    Years since quitting: 57.9  . Smokeless tobacco: Never Used  . Tobacco comment: Quit smoking many years ago.  Vaping Use  . Vaping Use: Never used  Substance and Sexual Activity  . Alcohol use: No  . Drug use: No  . Sexual activity: Not on file  Other Topics Concern  . Not on file  Social History Narrative  . Not on file   Social Determinants of Health   Financial Resource Strain: Not on file  Food Insecurity: Not on file  Transportation Needs: Not on file  Physical Activity: Not on file  Stress: Not on file  Social Connections: Not on file  Intimate Partner Violence: Not on file     BP (!) 96/56   Pulse 82   Ht 5' 10.5"  (1.791 m)   Wt 146 lb 9.6 oz (66.5 kg)   SpO2 (!) 88%   BMI 20.74 kg/m   Physical Exam:  Well appearing NAD HEENT: Unremarkable Neck:  6 cm JVD, no thyromegally Lymphatics:  No adenopathy Back:  No CVA tenderness Lungs:  Clear with no wheezes HEART:  Regular rate rhythm, no murmurs, no rubs, no clicks Abd:  soft, positive bowel sounds, no organomegally, no rebound, no guarding Ext:  2 plus pulses, no edema, no cyanosis, no clubbing Skin:  No rashes no nodules Neuro:  CN II through XII intact, motor grossly intact  EKG - nsr with ventricular pacing  DEVICE  Normal device function.  See PaceArt for details.   Assess/Plan: 1. CHB - he is doing well s/p PPM Insertion.  2. PPM - he has undergone gen change and his device is working normally and his incision looks good. 3. CAD - he denies anginal symptoms. We will follow. 4. HTN - his bp is now on the low side. He will continue toprol. If his pressure gets too low we will stop this med.  Carleene Overlie Ameliana Brashear,MD

## 2020-07-04 ENCOUNTER — Other Ambulatory Visit: Payer: Self-pay | Admitting: Interventional Cardiology

## 2020-07-07 ENCOUNTER — Other Ambulatory Visit: Payer: Self-pay | Admitting: Interventional Cardiology

## 2020-07-07 DIAGNOSIS — I10 Essential (primary) hypertension: Secondary | ICD-10-CM

## 2020-08-20 NOTE — Telephone Encounter (Signed)
Open in error

## 2020-09-07 ENCOUNTER — Other Ambulatory Visit: Payer: Self-pay | Admitting: Interventional Cardiology

## 2020-10-02 ENCOUNTER — Other Ambulatory Visit: Payer: Self-pay | Admitting: Interventional Cardiology

## 2020-10-26 ENCOUNTER — Other Ambulatory Visit: Payer: Self-pay | Admitting: Physician Assistant

## 2020-12-31 ENCOUNTER — Other Ambulatory Visit: Payer: Self-pay | Admitting: Physician Assistant

## 2021-01-05 ENCOUNTER — Other Ambulatory Visit: Payer: Self-pay | Admitting: Interventional Cardiology

## 2021-01-27 ENCOUNTER — Other Ambulatory Visit: Payer: Self-pay | Admitting: Interventional Cardiology

## 2021-02-11 ENCOUNTER — Other Ambulatory Visit: Payer: Self-pay | Admitting: Interventional Cardiology

## 2021-02-16 ENCOUNTER — Emergency Department (HOSPITAL_COMMUNITY)
Admission: EM | Admit: 2021-02-16 | Discharge: 2021-02-17 | Disposition: A | Discharge status: Home / Self Care | Payer: Medicare Other | Attending: Emergency Medicine | Admitting: Emergency Medicine

## 2021-02-16 ENCOUNTER — Emergency Department (HOSPITAL_COMMUNITY): Payer: Medicare Other

## 2021-02-16 ENCOUNTER — Encounter (HOSPITAL_COMMUNITY): Payer: Self-pay | Admitting: Emergency Medicine

## 2021-02-16 ENCOUNTER — Other Ambulatory Visit: Payer: Self-pay

## 2021-02-16 DIAGNOSIS — J189 Pneumonia, unspecified organism: Secondary | ICD-10-CM | POA: Diagnosis not present

## 2021-02-16 DIAGNOSIS — Z8546 Personal history of malignant neoplasm of prostate: Secondary | ICD-10-CM | POA: Diagnosis not present

## 2021-02-16 DIAGNOSIS — I2510 Atherosclerotic heart disease of native coronary artery without angina pectoris: Secondary | ICD-10-CM | POA: Diagnosis not present

## 2021-02-16 DIAGNOSIS — R4182 Altered mental status, unspecified: Secondary | ICD-10-CM | POA: Diagnosis present

## 2021-02-16 DIAGNOSIS — Z79899 Other long term (current) drug therapy: Secondary | ICD-10-CM | POA: Diagnosis not present

## 2021-02-16 DIAGNOSIS — Z950 Presence of cardiac pacemaker: Secondary | ICD-10-CM | POA: Insufficient documentation

## 2021-02-16 DIAGNOSIS — F0390 Unspecified dementia without behavioral disturbance: Secondary | ICD-10-CM | POA: Insufficient documentation

## 2021-02-16 DIAGNOSIS — Z7901 Long term (current) use of anticoagulants: Secondary | ICD-10-CM | POA: Diagnosis not present

## 2021-02-16 DIAGNOSIS — I110 Hypertensive heart disease with heart failure: Secondary | ICD-10-CM | POA: Diagnosis not present

## 2021-02-16 DIAGNOSIS — Z87891 Personal history of nicotine dependence: Secondary | ICD-10-CM | POA: Insufficient documentation

## 2021-02-16 DIAGNOSIS — I5032 Chronic diastolic (congestive) heart failure: Secondary | ICD-10-CM | POA: Insufficient documentation

## 2021-02-16 NOTE — ED Provider Notes (Signed)
Emergency Medicine Provider Triage Evaluation Note  [name] [name] , a 87 y.o. male  was evaluated in triage.  Pt complains of increased confusion since last night. Patient has history of dementia. Negative stroke screen.  Review of Systems  Positive: Confusion Negative: CP, SOB, abdominal pain  Physical Exam  BP 140/77 (BP Location: Right Arm)    Pulse 90    Temp (!) 97.5 F (36.4 C) (Oral)    Resp 20    SpO2 95%  Gen:   Awake, no distress   Resp:  Normal effort  MSK:   Moves extremities without difficulty  Other:  No abnormalities noted on neuro exam  Medical Decision Making  Medically screening exam initiated at 11:25 AM.  Appropriate orders placed.  [name] [name] was informed that the remainder of the evaluation will be completed by another provider, this initial triage assessment does not replace that evaluation, and the importance of remaining in the ED until their evaluation is complete.     Azucena Cecil, PA-C 02/16/21 [place], DO 02/16/21 1248

## 2021-02-16 NOTE — ED Triage Notes (Signed)
Pt arrives via POV from home with confusion this morning per son, attempting to wander outside. Per son has had increased confusion and slurred speech over the last few days. LSN was yesterday around 11:30pm. Pt currently awake, alert, appropriate, stroke scale neg.

## 2021-02-17 ENCOUNTER — Emergency Department (HOSPITAL_COMMUNITY): Payer: Medicare Other

## 2021-02-17 LAB — URINALYSIS, ROUTINE W REFLEX MICROSCOPIC
Bilirubin Urine: NEGATIVE
Glucose, UA: NEGATIVE mg/dL
Hgb urine dipstick: NEGATIVE
Ketones, ur: NEGATIVE mg/dL
Leukocytes,Ua: NEGATIVE
Nitrite: NEGATIVE
Protein, ur: 30 mg/dL — AB
Specific Gravity, Urine: >1.03 — ABNORMAL HIGH (ref 1.005–1.030)
pH: 5.5 (ref 5.0–8.0)

## 2021-02-17 LAB — CBC WITH DIFFERENTIAL/PLATELET
Abs Immature Granulocytes: 0.03 10*3/uL (ref 0.00–0.07)
Basophils Absolute: 0 10*3/uL (ref 0.0–0.1)
Basophils Relative: 0 %
Eosinophils Absolute: 0.1 10*3/uL (ref 0.0–0.5)
Eosinophils Relative: 1 %
HCT: 46.6 % (ref 39.0–52.0)
Hemoglobin: 15.6 g/dL (ref 13.0–17.0)
Immature Granulocytes: 0 %
Lymphocytes Relative: 19 %
Lymphs Abs: 1.7 10*3/uL (ref 0.7–4.0)
MCH: 33.7 pg (ref 26.0–34.0)
MCHC: 33.5 g/dL (ref 30.0–36.0)
MCV: 100.6 fL — ABNORMAL HIGH (ref 80.0–100.0)
Monocytes Absolute: 1 10*3/uL (ref 0.1–1.0)
Monocytes Relative: 11 %
Neutro Abs: 6.3 10*3/uL (ref 1.7–7.7)
Neutrophils Relative %: 69 %
Platelets: 283 10*3/uL (ref 150–400)
RBC: 4.63 MIL/uL (ref 4.22–5.81)
RDW: 14.1 % (ref 11.5–15.5)
WBC: 9.1 10*3/uL (ref 4.0–10.5)
nRBC: 0 % (ref 0.0–0.2)

## 2021-02-17 LAB — URINALYSIS, MICROSCOPIC (REFLEX): Bacteria, UA: NONE SEEN

## 2021-02-17 LAB — COMPREHENSIVE METABOLIC PANEL
ALT: 34 U/L (ref 0–44)
AST: 43 U/L — ABNORMAL HIGH (ref 15–41)
Albumin: 3.1 g/dL — ABNORMAL LOW (ref 3.5–5.0)
Alkaline Phosphatase: 93 U/L (ref 38–126)
Anion gap: 8 (ref 5–15)
BUN: 21 mg/dL (ref 8–23)
CO2: 21 mmol/L — ABNORMAL LOW (ref 22–32)
Calcium: 8.7 mg/dL — ABNORMAL LOW (ref 8.9–10.3)
Chloride: 110 mmol/L (ref 98–111)
Creatinine, Ser: 1.06 mg/dL (ref 0.61–1.24)
GFR, Estimated: >60 mL/min (ref >60)
Glucose, Bld: 108 mg/dL — ABNORMAL HIGH (ref 70–99)
Potassium: 4.1 mmol/L (ref 3.5–5.1)
Sodium: 139 mmol/L (ref 135–145)
Total Bilirubin: 1 mg/dL (ref 0.3–1.2)
Total Protein: 6 g/dL — ABNORMAL LOW (ref 6.5–8.1)

## 2021-02-17 MED ORDER — CEFDINIR 300 MG PO CAPS
300.0000 mg | ORAL_CAPSULE | ORAL | Status: AC
  Administered 2021-02-17: 04:00:00 300 mg via ORAL
  Filled 2021-02-17: qty 1

## 2021-02-17 MED ORDER — CEFDINIR 300 MG PO CAPS: 300.0000 mg | ORAL_CAPSULE | Freq: Two times a day (BID) | ORAL | 0 refills | Status: AC

## 2021-02-17 NOTE — ED Notes (Signed)
Urinal given to pt 

## 2021-02-17 NOTE — Discharge Instructions (Signed)
Return if you are having any problems at home. 

## 2021-02-17 NOTE — ED Notes (Addendum)
Pt lying in bed, tachypnic. Son at bedside. A/ox4 at this time. Per son, over the last 3 days pt has been found wandering in the garage or outside looking for things that arent there or acting in irregular ways. Son states this morning he noticed left facial droop and slurred speech x 40 minutes. Pt has possibly slight left droop. LKWT yesterday night. Pt and son also state pt has been more dyspnic and exertionally dyspnic. -edema noted, LS clear bilaterally. Pt denies CP but states he has pain in that area. Pt has very vague answers for questions.

## 2021-02-17 NOTE — ED Provider Notes (Signed)
[facility] EMERGENCY DEPARTMENT Provider Note   CSN: [id number] Arrival date & time: 02/16/21  1037     History Chief Complaint  Patient presents with   Altered Mental Status    [name] [name] is a 87 y.o. male.  The history is provided by a relative. The history is limited by the condition of the patient (Dementia).  Altered Mental Status He has history of hypertension, hyperlipidemia, complete heart block status post pacemaker insertion, prostate cancer and comes in because of episodes of confusion.  Family members noted that when he wakes up from sleep, he is confused and sometimes agitated.  He can take approximately 15 minutes to calm down at which point he then goes back to his normal state.  This has been present for the last 3 days.  He has not had any fever.  Appetite has been somewhat diminished.  There has been no nausea, vomiting, diarrhea and he has not had any urinary difficulty.  He does have some baseline dementia with short-term memory loss but is mainly functional.  There have been no new medications.   Past Medical History:  Diagnosis Date   Complete heart block (HCC)    Symptomatic   Coronary artery disease    GERD (gastroesophageal reflux disease)    Hearing loss    Hypertension    Iliac artery aneurysm, right (HCC)    Prostate cancer (HCC)    SVT (supraventricular tachycardia) ([place])     Patient Active Problem List   Diagnosis Date Noted   Abnormal chest x-ray 03/23/2016   Hyperlipidemia 01/31/2015   Coronary atherosclerosis of native coronary artery 12/04/2012   Aneurysm of iliac artery ([place]) 08/08/2012   ESSENTIAL HYPERTENSION, BENIGN 05/05/2010   AV BLOCK, COMPLETE 05/05/2010   CHRONIC DIASTOLIC HEART FAILURE 29/93/7169   CARDIAC PACEMAKER IN SITU 05/05/2010    Past Surgical History:  Procedure Laterality Date   CARDIAC CATHETERIZATION  2007   CATARACT EXTRACTION     Right eye   INGUINAL HERNIA REPAIR     Right   INSERT /  REPLACE / REMOVE PACEMAKER  01/20/10   dual-chamber permanent pacemaker implantation by Dr. Cristopher Peru   KIDNEY STONE SURGERY     Left-sided   PPM GENERATOR CHANGEOUT N/A 04/01/2020   Procedure: PPM GENERATOR CHANGEOUT;  Surgeon: Evans Lance, MD;  Location: [place] [facility];  Service: Cardiovascular;  Laterality: N/A;   ROTATOR CUFF REPAIR     Right       Family History  Problem Relation Age of Onset   Breast cancer Mother    Hypertension Father    CVA Father    Stroke Father    Prostate cancer Brother    Prostate cancer Brother    Cancer Sister        esophageal   Heart attack Neg Hx     Social History   Tobacco Use   Smoking status: Former    Types: Cigarettes    Quit date: 08/09/1962    Years since quitting: 58.5   Smokeless tobacco: Never   Tobacco comments:    Quit smoking many years ago.  Vaping Use   Vaping Use: Never used  Substance Use Topics   Alcohol use: No   Drug use: No    Home Medications Prior to Admission medications   Medication Sig Start Date End Date Taking? Authorizing Provider  acetaminophen (TYLENOL) 500 MG tablet Take 500 mg by mouth every 6 (six) hours as needed (pain).  [provider]  b complex vitamins tablet Take 1 tablet by mouth daily.    [provider]  clopidogrel (PLAVIX) 75 MG tablet TAKE 1 TABLET BY MOUTH EVERY DAY 07/04/20   Jettie Booze, MD  docusate sodium (COLACE) 100 MG capsule Take 100 mg by mouth daily as needed for mild constipation.    [provider]  furosemide (LASIX) 40 MG tablet TAKE 1 TABLET EVERY OTHER DAY, ALTERNATING DOSE OF 1 TABLET TWICE A DAY. Please make overdue appt with Dr. Irish Lack before anymore refills. Thank you 3rd and Final attempt 01/28/21   Jettie Booze, MD  losartan (COZAAR) 50 MG tablet TAKE 1 TABLET BY MOUTH EVERY DAY 06/23/20   Jettie Booze, MD  metoprolol succinate (TOPROL-XL) 25 MG 24 hr tablet TAKE 1 TABLET BY MOUTH EVERY DAY 07/07/20    Jettie Booze, MD  Multiple Vitamins-Minerals (CENTRUM SILVER) tablet Take 1 tablet by mouth daily.    [provider]  Multiple Vitamins-Minerals (ICAPS MV) TABS Take 1 tablet by mouth daily.    [provider]  omeprazole (PRILOSEC) 40 MG capsule Take 40 mg by mouth daily. 01/28/16   [provider]  potassium chloride (MICRO-K) 10 MEQ CR capsule TAKE 1 CAPSULE BY MOUTH EVERY DAY 07/07/20   Jettie Booze, MD  pravastatin (PRAVACHOL) 20 MG tablet TAKE 1 TABLET BY MOUTH EVERY DAY 07/04/20   Jettie Booze, MD    Allergies    Penicillins  Review of Systems   Review of Systems  All other systems reviewed and are negative.  Physical Exam Updated Vital Signs BP (!) 149/91    Pulse 96    Temp 97.7 F (36.5 C)    Resp 16    SpO2 93%   Physical Exam Vitals and nursing note reviewed.  87 year old male, resting comfortably and in no acute distress. Vital signs are significant for elevated blood pressure. Oxygen saturation is 93%, which is normal. Head is normocephalic and atraumatic. PERRLA, EOMI. Oropharynx is clear. Neck is nontender and supple without adenopathy or JVD. There are no carotid bruits. Back is nontender and there is no CVA tenderness. Lungs are clear without rales, wheezes, or rhonchi. Chest is nontender. Heart has regular rate and rhythm without murmur. Abdomen is soft, flat, nontender without masses or hepatosplenomegaly and peristalsis is normoactive. Extremities have 1+ edema, full range of motion is present. Skin is warm and dry without rash. Neurologic: Alert and conversant, oriented to person and place and mostly oriented to time, cranial nerves are intact, there are no motor or sensory deficits.  ED Results / Procedures / Treatments   Labs (all labs ordered are listed, but only abnormal results are displayed) Labs Reviewed  COMPREHENSIVE METABOLIC PANEL - Abnormal; Notable for the following components:      Result Value    CO2 21 (*)    Glucose, Bld 108 (*)    Calcium 8.7 (*)    Total Protein 6.0 (*)    Albumin 3.1 (*)    AST 43 (*)    All other components within normal limits  CBC WITH DIFFERENTIAL/PLATELET - Abnormal; Notable for the following components:   MCV 100.6 (*)    All other components within normal limits  URINALYSIS, ROUTINE W REFLEX MICROSCOPIC - Abnormal; Notable for the following components:   Specific Gravity, Urine >1.030 (*)    Protein, ur 30 (*)    All other components within normal limits  URINALYSIS, MICROSCOPIC (REFLEX)  EKG EKG Interpretation  Date/Time:  Monday February 16 2021 11:01:00 EST Ventricular Rate:  94 PR Interval:    QRS Duration: 132 QT Interval:  408 QTC Calculation: 510 R Axis:   -74 Text Interpretation: Ventricular-paced rhythm with occasional Premature ventricular complexes Abnormal ECG Confirmed by Noemi Chapel [phone]) on 02/16/2021 6:01:50 PM  Radiology CT Head Wo Contrast  Result Date: 02/16/2021 CLINICAL DATA:  Mental status change. EXAM: CT HEAD WITHOUT CONTRAST TECHNIQUE: Contiguous axial images were obtained from the base of the skull through the vertex without intravenous contrast. COMPARISON:  Brain MRI report 2001 FINDINGS: Brain: No acute intracranial hemorrhage. No focal mass lesion. No CT evidence of acute infarction. No midline shift or mass effect. No hydrocephalus. Basilar cisterns are patent. Prominent CSF density extra-axial space over the LEFT frontal lobe (image 17/4 for example). There are periventricular and subcortical white matter hypodensities. Generalized cortical atrophy. Vascular: No hyperdense vessel or unexpected calcification. Skull: Normal. Negative for fracture or focal lesion. Sinuses/Orbits: Paranasal sinuses and mastoid air cells are clear. Orbits are clear. Other: None. IMPRESSION: 1. No acute intracranial findings. 2. Chronic white matter microvascular disease and generalized cortical atrophy. Electronically Signed    By: Suzy Bouchard M.D.   On: 02/16/2021 12:26   DG Chest Port 1 View  Result Date: 02/17/2021 CLINICAL DATA:  Altered mental status. EXAM: PORTABLE CHEST 1 VIEW COMPARISON:  January 08, 2018 FINDINGS: There is a dual lead AICD with stable lead wire positioning. Mild left suprahilar infiltrate is seen. Mild areas of bibasilar atelectasis and/or infiltrate are also noted, left greater than right. There is a very small left pleural effusion. No pneumothorax is identified. The heart size and mediastinal contours are within normal limits. Degenerative changes are seen throughout the thoracic spine. IMPRESSION: 1. Mild left suprahilar infiltrate. 2. Mild bibasilar atelectasis and/or infiltrate, left greater than right. 3. Very small left pleural effusion. Electronically Signed   By: Virgina Norfolk M.D.   On: 02/17/2021 01:22    Procedures Procedures   Medications Ordered in ED Medications  cefdinir (OMNICEF) capsule 300 mg (has no administration in time range)    ED Course  I have reviewed the triage vital signs and the nursing notes.  Pertinent labs & imaging results that were available during my care of the patient were reviewed by me and considered in my medical decision making (see chart for details).   MDM Rules/Calculators/A&P                         Confusion and agitation on waking.  This may be exacerbation of pre-existing dementia.  Need to rule out electrolyte disturbance, occult infection.  CT of head was ordered at triage and shows no acute process.  Will check chest x-ray, screening labs, urinalysis.  Old records are reviewed, and he has no relevant past visits.  Labs are unremarkable, urinalysis shows no evidence of infection.  Chest x-ray shows evidence of suprahilar infiltrate on the left which probably accounts for his behavior changes.  He is maintaining good oxygen saturation at rest.  I have discussed with patient and family member options of inpatient versus outpatient  treatment and they prefer to try outpatient treatment.  He is given a prescription for cefdinir, advised to return if there are are any problems at home.  If confusion does not improve with treatment of pneumonia, will need neurology evaluation and possible geriatric psychiatric evaluation.  Recommended follow-up with primary care provider after completion  of course of antibiotics.  Final Clinical Impression(s) / ED Diagnoses Final diagnoses:  Community acquired pneumonia of left lung, unspecified part of lung   Rx / DC Orders ED Discharge Orders          Ordered    cefdinir (OMNICEF) 300 MG capsule  2 times daily        02/17/21 0998             Delora Fuel, MD 33/82/50 [phone]

## 2021-02-26 ENCOUNTER — Emergency Department (HOSPITAL_COMMUNITY)
Admission: EM | Admit: 2021-02-26 | Discharge: 2021-03-25 | Disposition: E | Payer: Medicare Other | Attending: Emergency Medicine | Admitting: Emergency Medicine

## 2021-02-26 ENCOUNTER — Emergency Department (HOSPITAL_COMMUNITY): Payer: Medicare Other

## 2021-02-26 ENCOUNTER — Other Ambulatory Visit: Payer: Self-pay

## 2021-02-26 DIAGNOSIS — I5033 Acute on chronic diastolic (congestive) heart failure: Secondary | ICD-10-CM | POA: Insufficient documentation

## 2021-02-26 DIAGNOSIS — J189 Pneumonia, unspecified organism: Secondary | ICD-10-CM | POA: Insufficient documentation

## 2021-02-26 DIAGNOSIS — I2510 Atherosclerotic heart disease of native coronary artery without angina pectoris: Secondary | ICD-10-CM | POA: Insufficient documentation

## 2021-02-26 DIAGNOSIS — F05 Delirium due to known physiological condition: Secondary | ICD-10-CM | POA: Insufficient documentation

## 2021-02-26 DIAGNOSIS — R410 Disorientation, unspecified: Secondary | ICD-10-CM

## 2021-02-26 DIAGNOSIS — I2694 Multiple subsegmental pulmonary emboli without acute cor pulmonale: Secondary | ICD-10-CM | POA: Diagnosis not present

## 2021-02-26 DIAGNOSIS — Z8546 Personal history of malignant neoplasm of prostate: Secondary | ICD-10-CM | POA: Insufficient documentation

## 2021-02-26 DIAGNOSIS — I110 Hypertensive heart disease with heart failure: Secondary | ICD-10-CM | POA: Insufficient documentation

## 2021-02-26 DIAGNOSIS — R4182 Altered mental status, unspecified: Secondary | ICD-10-CM | POA: Diagnosis present

## 2021-02-26 DIAGNOSIS — Z20822 Contact with and (suspected) exposure to covid-19: Secondary | ICD-10-CM | POA: Diagnosis not present

## 2021-02-26 DIAGNOSIS — Z7902 Long term (current) use of antithrombotics/antiplatelets: Secondary | ICD-10-CM | POA: Insufficient documentation

## 2021-02-26 DIAGNOSIS — J9601 Acute respiratory failure with hypoxia: Secondary | ICD-10-CM | POA: Insufficient documentation

## 2021-02-26 DIAGNOSIS — Z79899 Other long term (current) drug therapy: Secondary | ICD-10-CM | POA: Diagnosis not present

## 2021-02-26 DIAGNOSIS — N179 Acute kidney failure, unspecified: Secondary | ICD-10-CM | POA: Insufficient documentation

## 2021-02-26 MED ORDER — ALBUTEROL SULFATE HFA 108 (90 BASE) MCG/ACT IN AERS: 2.0000 | INHALATION_SPRAY | RESPIRATORY_TRACT | Status: DC | PRN

## 2021-02-26 NOTE — ED Notes (Signed)
XR at bedside

## 2021-02-26 NOTE — ED Notes (Signed)
  Pt transported to ct 

## 2021-02-26 NOTE — ED Triage Notes (Signed)
Pt BIB [facility] from home d/t acute onset of increased agitation. EMS reports family gave newly prescribed ativan at approx 8 pm. Family then noticed increased agitation from pt and increased work of breathing. On arrival pt is 86% on room air, placed back on 2 L of oxygen, normally on room air. Pt has dementia at baseline, unknown baseline orientation.

## 2021-02-26 NOTE — ED Provider Notes (Addendum)
[facility] EMERGENCY DEPARTMENT Provider Note  CSN: [id number] Arrival date & time: 03/09/2021 2213  Chief Complaint(s) Agitation  HPI [name] [name] is a 88 y.o. male with a past medical history listed below who presents to the emergency department for AMS and agitation.  Patient is accompanied by son who is providing the history.  Remainder of history, ROS, and physical exam limited due to patient's condition (-mental status). Additional information was obtained from son.   Level V Caveat.  Son reports that the patient was at his normal state of health 2 days prior to Christmas, which included mild memory deficits but able to perform ADLs for himself including driving and self-care.  Patient used to walk with a cane.  On December 26 the patient was seen for altered mental status.  CT head at that time was negative.  He was diagnosed with community-acquired pneumonia and placed on antibiotics which he just finished yesterday.   During that time the patient's mental status has been fluctuating.  Reportedly somewhat lucid throughout the day but at night becomes agitated and has increased work of breathing.  Son spoke with the PCP who prescribed the patient Ativan.  He was given the first dose tonight.  Patient's agitation was significantly worse after being given the medication prompting the call to EMS.  EMS noted that the patient's oxygen saturations were in the 80s.  He is moving all extremities.  Sent denies any emesis or diarrhea.  Reported somewhat darker stools.  The history is provided by a relative.   Past Medical History Past Medical History:  Diagnosis Date   Complete heart block (HCC)    Symptomatic   Coronary artery disease    GERD (gastroesophageal reflux disease)    Hearing loss    Hypertension    Iliac artery aneurysm, right (HCC)    Prostate cancer (HCC)    SVT (supraventricular tachycardia) (HCC)    Patient Active Problem List   Diagnosis Date  Noted   Abnormal chest x-ray 03/23/2016   Hyperlipidemia 01/31/2015   Coronary atherosclerosis of native coronary artery 12/04/2012   Aneurysm of iliac artery ([place]) 08/08/2012   ESSENTIAL HYPERTENSION, BENIGN 05/05/2010   AV BLOCK, COMPLETE 05/05/2010   CHRONIC DIASTOLIC HEART FAILURE 56/81/2751   CARDIAC PACEMAKER IN SITU 05/05/2010   Home Medication(s) Prior to Admission medications   Medication Sig Start Date End Date Taking? Authorizing Provider  acetaminophen (TYLENOL) 500 MG tablet Take 500 mg by mouth every 6 (six) hours as needed (pain).    Yes [provider]  Azelastine HCl 137 MCG/SPRAY SOLN Place 2 sprays into both nostrils 2 (two) times daily as needed (allergies). 02/07/21  Yes [provider]  b complex vitamins tablet Take 1 tablet by mouth daily.   Yes [provider]  clopidogrel (PLAVIX) 75 MG tablet TAKE 1 TABLET BY MOUTH EVERY DAY Patient taking differently: Take 75 mg by mouth daily. 07/04/20  Yes Jettie Booze, MD  docusate sodium (COLACE) 100 MG capsule Take 100 mg by mouth daily as needed for mild constipation.   Yes [provider]  fluconazole (DIFLUCAN) 100 MG tablet Take 100 mg by mouth See admin instructions. Qd x 7 days 02/25/21  Yes [provider]  furosemide (LASIX) 40 MG tablet TAKE 1 TABLET EVERY OTHER DAY, ALTERNATING DOSE OF 1 TABLET TWICE A DAY. Please make overdue appt with Dr. Irish Lack before anymore refills. Thank you 3rd and Final attempt Patient taking differently: Take 40 mg by  mouth daily. . 01/28/21  Yes Jettie Booze, MD  LORazepam (ATIVAN) 0.5 MG tablet Take 0.5-1 mg by mouth at bedtime as needed for anxiety or sleep. 02/23/2021  Yes [provider]  losartan (COZAAR) 50 MG tablet TAKE 1 TABLET BY MOUTH EVERY DAY Patient taking differently: Take 50 mg by mouth daily. 06/23/20  Yes Jettie Booze, MD  metoprolol succinate (TOPROL-XL) 25 MG 24 hr tablet TAKE 1 TABLET BY MOUTH EVERY  DAY Patient taking differently: Take 25 mg by mouth at bedtime. 07/07/20  Yes Jettie Booze, MD  Multiple Vitamins-Minerals (CENTRUM SILVER) tablet Take 1 tablet by mouth daily.   Yes [provider]  Multiple Vitamins-Minerals (ICAPS MV) TABS Take 1 tablet by mouth daily.   Yes [provider]  Omeprazole 20 MG TBDD Take 20 mg by mouth daily. 01/28/16  Yes [provider]  potassium chloride (MICRO-K) 10 MEQ CR capsule TAKE 1 CAPSULE BY MOUTH EVERY DAY Patient taking differently: Take 10 mEq by mouth daily. 07/07/20  Yes Jettie Booze, MD  pravastatin (PRAVACHOL) 20 MG tablet TAKE 1 TABLET BY MOUTH EVERY DAY Patient taking differently: Take 20 mg by mouth at bedtime. 07/04/20  Yes Jettie Booze, MD  triamcinolone cream (KENALOG) 0.1 % Apply 1 application topically in the morning and at bedtime. 01/12/21  Yes [provider]  cefdinir (OMNICEF) 300 MG capsule Take 1 capsule (300 mg total) by mouth 2 (two) times daily. Patient not taking: Reported on 07/29/6193 09/32/67   Delora Fuel, MD                                                                                                                                    Allergies Penicillins  Review of Systems Review of Systems As noted in HPI  Physical Exam Vital Signs  I have reviewed the triage vital signs BP (!) 153/93    Pulse 82    Temp 97.6 F (36.4 C) (Rectal)    Resp (!) 25    SpO2 93%   Physical Exam Vitals reviewed.  Constitutional:      General: He is not in acute distress.    Appearance: He is well-developed. He is not diaphoretic.  HENT:     Head: Normocephalic and atraumatic.     Nose: Nose normal.     Mouth/Throat:     Mouth: Mucous membranes are dry.  Eyes:     General: No scleral icterus.       Right eye: No discharge.        Left eye: No discharge.     Conjunctiva/sclera: Conjunctivae normal.     Pupils: Pupils are equal, round, and reactive to light.   Cardiovascular:     Rate and Rhythm: Normal rate and regular rhythm.     Heart sounds: No murmur heard.   No friction rub. No gallop.  Pulmonary:     Effort:  Tachypnea and respiratory distress present.     Breath sounds: Decreased air movement present. No stridor. Examination of the right-middle field reveals rales. Examination of the right-lower field reveals rales. Examination of the left-lower field reveals rales. Rales present.  Abdominal:     General: There is no distension.     Palpations: Abdomen is soft.     Tenderness: There is no abdominal tenderness.  Musculoskeletal:        General: No tenderness.     Cervical back: Normal range of motion and neck supple.  Skin:    General: Skin is warm and dry.     Findings: No erythema or rash.  Neurological:     Comments: Sleepy. Appears delirious and is not following commands. Oriented to self only. Moves all extremities with good strength, but jerking movements     ED Results and Treatments Labs (all labs ordered are listed, but only abnormal results are displayed) Labs Reviewed  COMPREHENSIVE METABOLIC PANEL - Abnormal; Notable for the following components:      Result Value   CO2 21 (*)    Glucose, Bld 103 (*)    Creatinine, Ser 1.36 (*)    AST 53 (*)    Total Bilirubin 1.4 (*)    GFR, Estimated 47 (*)    All other components within normal limits  CBC WITH DIFFERENTIAL/PLATELET - Abnormal; Notable for the following components:   Hemoglobin 17.2 (*)    HCT 52.9 (*)    MCV 103.5 (*)    Neutro Abs 7.8 (*)    Monocytes Absolute 1.1 (*)    All other components within normal limits  PROTIME-INR - Abnormal; Notable for the following components:   Prothrombin Time 16.5 (*)    INR 1.3 (*)    All other components within normal limits  URINALYSIS, ROUTINE W REFLEX MICROSCOPIC - Abnormal; Notable for the following components:   Color, Urine AMBER (*)    APPearance HAZY (*)    Ketones, ur 5 (*)    Protein, ur 30 (*)     All other components within normal limits  BRAIN NATRIURETIC PEPTIDE - Abnormal; Notable for the following components:   B Natriuretic Peptide 3,116.9 (*)    All other components within normal limits  I-STAT CHEM 8, ED - Abnormal; Notable for the following components:   BUN 25 (*)    Creatinine, Ser 1.30 (*)    Calcium, Ion 1.12 (*)    Hemoglobin 18.0 (*)    HCT 53.0 (*)    All other components within normal limits  I-STAT VENOUS BLOOD GAS, ED - Abnormal; Notable for the following components:   Hemoglobin 17.7 (*)    All other components within normal limits  TROPONIN I (HIGH SENSITIVITY) - Abnormal; Notable for the following components:   Troponin I (High Sensitivity) 48 (*)    All other components within normal limits  RESP PANEL BY RT-PCR (FLU A&B, COVID) ARPGX2  CULTURE, BLOOD (ROUTINE X 2)  CULTURE, BLOOD (ROUTINE X 2)  LACTIC ACID, PLASMA  APTT  AMMONIA  LACTIC ACID, PLASMA  HEPARIN LEVEL (UNFRACTIONATED)  TROPONIN I (HIGH SENSITIVITY)  EKG  EKG Interpretation  Date/Time:  Thursday February 26 2021 22:21:46 EST Ventricular Rate:  95 PR Interval:  162 QRS Duration: 146 QT Interval:  411 QTC Calculation: 517 R Axis:   -76 Text Interpretation: ventricularly paced Confirmed by Addison Lank [id number]) on 15-Mar-2021 12:21:04 AM       Radiology CT Head Wo Contrast  Result Date: 02/24/2021 CLINICAL DATA:  Altered mental status EXAM: CT HEAD WITHOUT CONTRAST TECHNIQUE: Contiguous axial images were obtained from the base of the skull through the vertex without intravenous contrast. COMPARISON:  02/16/2021 FINDINGS: Brain: Mild parenchymal volume loss is commensurate with the patient's age. Moderate patchy subcortical and periventricular white matter changes are again seen, likely the sequela of small vessel ischemia. Remote lacunar infarct noted within the left  insular cortex. No acute intracranial hemorrhage or infarct. Small bifrontal subdural hygromas, left greater than right, are again identified and are unchanged. Minimal mass effect upon the left frontal cortex is stable. No midline shift. Ventricular size is normal. Cerebellum is unremarkable. No abnormal intra or extra-axial mass lesion. Vascular: No hyperdense vessel or unexpected calcification. Skull: Normal. Negative for fracture or focal lesion. Sinuses/Orbits: Paranasal sinuses are clear. Ocular lenses have been removed. Orbits are otherwise unremarkable. Other: Mastoid air cells and middle ear cavities are clear. IMPRESSION: Stable small bifrontal subdural hygroma with minimal mass effect upon the left frontal cortex. No acute intracranial hemorrhage. No acute infarct. Remote lacunar infarct within the left insular cortex. Moderate periventricular white matter changes, likely the sequela of small vessel ischemia, unchanged. Electronically Signed   By: Fidela Salisbury M.D.   On: 03/12/2021 23:54   CT Angio Chest PE W and/or Wo Contrast  Result Date: March 15, 2021 CLINICAL DATA:  Pulmonary embolism (PE) suspected, unknown D-dimer hypoxia; Bowel obstruction suspected EXAM: CT ANGIOGRAPHY CHEST CT ABDOMEN AND PELVIS WITH CONTRAST TECHNIQUE: Multidetector CT imaging of the chest was performed using the standard protocol during bolus administration of intravenous contrast. Multiplanar CT image reconstructions and MIPs were obtained to evaluate the vascular anatomy. Multidetector CT imaging of the abdomen and pelvis was performed using the standard protocol during bolus administration of intravenous contrast. CONTRAST:  52mL OMNIPAQUE IOHEXOL 350 MG/ML SOLN COMPARISON:  None. FINDINGS: CTA CHEST FINDINGS Cardiovascular: There is adequate opacification of the pulmonary arterial tree. While there is mixing artifact noted within the pulmonary artery centrally, there is a intraluminal branching filling defect identified  within the segmental pulmonary arteries of the anterior and apical segments of the right upper lobe in keeping with acute pulmonary embolism. Possible small similar filling defect within the anterior segmental pulmonary artery of the left upper lobe. The embolic burden is small. The central pulmonary arteries are enlarged in keeping with changes of pulmonary arterial hypertension. Moderate coronary artery calcification. Mild global cardiomegaly. There is enlargement of the right ventricle and right atrium with reversal of the normal RV/LV ratio (1.12). While this is technically compatible with changes of right heart strain, the chronicity of this finding is not clearly determined on this examination alone. No pericardial effusion. Moderate atherosclerotic calcification within the thoracic aorta. The ascending aorta and proximal descending thoracic aorta are mildly dilated measuring 4.1 cm and 3.3 cm in greatest dimension. The distal descending thoracic aorta is of normal caliber. Mediastinum/Nodes: No enlarged mediastinal, hilar, or axillary lymph nodes. Thyroid gland, trachea, and esophagus demonstrate no significant findings. Lungs/Pleura: Moderate right and large left pleural effusions are present. Superimposed extensive airspace infiltrate is seen throughout the left lung. Patchy ground-glass infiltrate is  noted within the right lung, most in keeping with changes of multifocal pneumonia in the acute setting. No pneumothorax. Musculoskeletal: No acute bone abnormality. Review of the MIP images confirms the above findings. CT ABDOMEN and PELVIS FINDINGS Hepatobiliary: No focal liver abnormality is seen. No gallstones, gallbladder wall thickening, or biliary dilatation. Pancreas: Unremarkable Spleen: Unremarkable Adrenals/Urinary Tract: The adrenal glands are unremarkable. The kidneys are atrophic bilaterally but are otherwise unremarkable. Bladder unremarkable. Stomach/Bowel: Moderate sigmoid diverticulosis. The  stomach, large bowel, and small bowel are otherwise unremarkable. No free intraperitoneal gas. No free fluid. Vascular/Lymphatic: Moderate aortoiliac atherosclerotic calcification. No aortic aneurysm. No pathologic adenopathy within the abdomen and pelvis. Reproductive: Prostate is unremarkable. Other: No abdominal wall hernia. Mild diffuse subcutaneous body wall edema. Musculoskeletal: Osseous structures are age-appropriate. No acute bone abnormality. Review of the MIP images confirms the above findings. IMPRESSION: Acute pulmonary embolism. The embolic burden is small. Enlargement of the central pulmonary arteries is present in keeping with morphologic changes of pulmonary arterial hypertension. There is enlargement of the a right heart with reversal of the normal RV/LV ratio in keeping with changes of right heart strain. The acuity of this finding, however, is not clearly determined on this examination. Correlation with recent echocardiographic studies, if available, would be helpful in assessing acute change. Extensive multifocal pulmonary infiltrate, more extensive throughout the left lung, in keeping with changes of acute multifocal pneumonia. Moderate right and large left pleural effusions. Moderate coronary artery calcification. Mild global cardiomegaly. Mild dilation of the thoracic aorta with maximal transaxial dimension of 4.1 cm in its ascending segment. Recommend annual imaging followup by CTA or MRA. This recommendation follows 2010 ACCF/AHA/AATS/ACR/ASA/SCA/SCAI/SIR/STS/SVM Guidelines for the Diagnosis and Management of Patients with Thoracic Aortic Disease. Circulation. 2010; 121: F751-W258. Aortic aneurysm NOS (ICD10-I71.9) No acute intra-abdominal pathology identified. Electronically Signed   By: Fidela Salisbury M.D.   On: 03/16/21 03:27   CT ABDOMEN PELVIS W CONTRAST  Result Date: Mar 16, 2021 CLINICAL DATA:  Pulmonary embolism (PE) suspected, unknown D-dimer hypoxia; Bowel obstruction suspected  EXAM: CT ANGIOGRAPHY CHEST CT ABDOMEN AND PELVIS WITH CONTRAST TECHNIQUE: Multidetector CT imaging of the chest was performed using the standard protocol during bolus administration of intravenous contrast. Multiplanar CT image reconstructions and MIPs were obtained to evaluate the vascular anatomy. Multidetector CT imaging of the abdomen and pelvis was performed using the standard protocol during bolus administration of intravenous contrast. CONTRAST:  3mL OMNIPAQUE IOHEXOL 350 MG/ML SOLN COMPARISON:  None. FINDINGS: CTA CHEST FINDINGS Cardiovascular: There is adequate opacification of the pulmonary arterial tree. While there is mixing artifact noted within the pulmonary artery centrally, there is a intraluminal branching filling defect identified within the segmental pulmonary arteries of the anterior and apical segments of the right upper lobe in keeping with acute pulmonary embolism. Possible small similar filling defect within the anterior segmental pulmonary artery of the left upper lobe. The embolic burden is small. The central pulmonary arteries are enlarged in keeping with changes of pulmonary arterial hypertension. Moderate coronary artery calcification. Mild global cardiomegaly. There is enlargement of the right ventricle and right atrium with reversal of the normal RV/LV ratio (1.12). While this is technically compatible with changes of right heart strain, the chronicity of this finding is not clearly determined on this examination alone. No pericardial effusion. Moderate atherosclerotic calcification within the thoracic aorta. The ascending aorta and proximal descending thoracic aorta are mildly dilated measuring 4.1 cm and 3.3 cm in greatest dimension. The distal descending thoracic aorta is of normal caliber. Mediastinum/Nodes:  No enlarged mediastinal, hilar, or axillary lymph nodes. Thyroid gland, trachea, and esophagus demonstrate no significant findings. Lungs/Pleura: Moderate right and large left  pleural effusions are present. Superimposed extensive airspace infiltrate is seen throughout the left lung. Patchy ground-glass infiltrate is noted within the right lung, most in keeping with changes of multifocal pneumonia in the acute setting. No pneumothorax. Musculoskeletal: No acute bone abnormality. Review of the MIP images confirms the above findings. CT ABDOMEN and PELVIS FINDINGS Hepatobiliary: No focal liver abnormality is seen. No gallstones, gallbladder wall thickening, or biliary dilatation. Pancreas: Unremarkable Spleen: Unremarkable Adrenals/Urinary Tract: The adrenal glands are unremarkable. The kidneys are atrophic bilaterally but are otherwise unremarkable. Bladder unremarkable. Stomach/Bowel: Moderate sigmoid diverticulosis. The stomach, large bowel, and small bowel are otherwise unremarkable. No free intraperitoneal gas. No free fluid. Vascular/Lymphatic: Moderate aortoiliac atherosclerotic calcification. No aortic aneurysm. No pathologic adenopathy within the abdomen and pelvis. Reproductive: Prostate is unremarkable. Other: No abdominal wall hernia. Mild diffuse subcutaneous body wall edema. Musculoskeletal: Osseous structures are age-appropriate. No acute bone abnormality. Review of the MIP images confirms the above findings. IMPRESSION: Acute pulmonary embolism. The embolic burden is small. Enlargement of the central pulmonary arteries is present in keeping with morphologic changes of pulmonary arterial hypertension. There is enlargement of the a right heart with reversal of the normal RV/LV ratio in keeping with changes of right heart strain. The acuity of this finding, however, is not clearly determined on this examination. Correlation with recent echocardiographic studies, if available, would be helpful in assessing acute change. Extensive multifocal pulmonary infiltrate, more extensive throughout the left lung, in keeping with changes of acute multifocal pneumonia. Moderate right and  large left pleural effusions. Moderate coronary artery calcification. Mild global cardiomegaly. Mild dilation of the thoracic aorta with maximal transaxial dimension of 4.1 cm in its ascending segment. Recommend annual imaging followup by CTA or MRA. This recommendation follows 2010 ACCF/AHA/AATS/ACR/ASA/SCA/SCAI/SIR/STS/SVM Guidelines for the Diagnosis and Management of Patients with Thoracic Aortic Disease. Circulation. 2010; 121: Y850-Y774. Aortic aneurysm NOS (ICD10-I71.9) No acute intra-abdominal pathology identified. Electronically Signed   By: Fidela Salisbury M.D.   On: 2021-03-28 03:27   DG Chest Portable 1 View  Result Date: 03/22/2021 CLINICAL DATA:  Increased agitation and shortness of breath. EXAM: PORTABLE CHEST 1 VIEW COMPARISON:  February 17, 2021 FINDINGS: A dual lead AICD is noted. The lungs are hyperinflated. Diffuse, chronic appearing increased interstitial lung markings are present. Moderate to marked severity atelectasis and/or infiltrate is seen within the left upper lobe. This represents a new finding when compared to the prior study. Stable mild to moderate severity left basilar atelectasis and/or infiltrate is present. A small left pleural effusion is noted. No pneumothorax is identified. The heart size and mediastinal contours are within normal limits. Multilevel degenerative changes seen throughout the thoracic spine. IMPRESSION: 1. Interval development of moderate to marked severity left upper lobe atelectasis and/or infiltrate since the prior exam. 2. Stable mild to moderate severity left basilar atelectasis and/or infiltrate. 3. Small left pleural effusion. Electronically Signed   By: Virgina Norfolk M.D.   On: 03/02/2021 22:50    Pertinent labs & imaging results that were available during my care of the patient were reviewed by me and considered in my medical decision making (see MDM for details).  Medications Ordered in ED Medications  albuterol (VENTOLIN HFA) 108 (90 Base)  MCG/ACT inhaler 2 puff (has no administration in time range)  levofloxacin (LEVAQUIN) IVPB 750 mg (has no administration in time range)  heparin  bolus via infusion 4,000 Units (has no administration in time range)  heparin ADULT infusion 100 units/mL (25000 units/262mL) (has no administration in time range)  vancomycin (VANCOREADY) IVPB 1250 mg/250 mL (has no administration in time range)  vancomycin (VANCOREADY) IVPB 1250 mg/250 mL (has no administration in time range)  HYDROmorphone (DILAUDID) injection 0.5 mg (0.5 mg Intravenous Given 2021/03/17 0121)  iohexol (OMNIPAQUE) 350 MG/ML injection 80 mL (80 mLs Intravenous Contrast Given Mar 17, 2021 0258)  furosemide (LASIX) injection 60 mg (60 mg Intravenous Given Mar 17, 2021 0327)                                                                                                                                     Procedures .1-3 Lead EKG Interpretation Performed by: Fatima Blank, MD Authorized by: Fatima Blank, MD     Interpretation: normal     ECG rate:  62   ECG rate assessment: normal     Rhythm comment:  Ventricularly paced   Ectopy: none     Conduction: normal   .Critical Care Performed by: Fatima Blank, MD Authorized by: Fatima Blank, MD   Critical care provider statement:    Critical care time (minutes):  80   Critical care time was exclusive of:  Separately billable procedures and treating other patients   Critical care was necessary to treat or prevent imminent or life-threatening deterioration of the following conditions:  Respiratory failure and cardiac failure   Critical care was time spent personally by me on the following activities:  Development of treatment plan with patient or surrogate, discussions with consultants, evaluation of patient's response to treatment, examination of patient, obtaining history from patient or surrogate, review of old charts, re-evaluation of patient's condition, pulse  oximetry, ordering and review of radiographic studies, ordering and review of laboratory studies and ordering and performing treatments and interventions   Care discussed with: admitting provider    (including critical care time)  Medical Decision Making / ED Course     Patient presented for altered mental status/agitation and noted to be hypoxic. Appears to be delirious.  He is hypoxic with sats in the 80s on room air. Increased work of breathing with bilateral rales.  Patient was previously treated for pneumonia and just finished antibiotics. Patient appears to be dehydrated with dry mucous membranes.  No evidence of volume overload on exam.  Altered mental status work-up obtained.  We will also obtain lung imaging to evaluate source of hypoxia.  Patient was given IV fluids and placed on supplemental oxygen.  I independently interpreted the labs ordered as follows. Work-up is consistent hemoconcentration and AKI likely from dehydration. No leukocytosis and normal lactic acid inconsistent with severe sepsis. Ammonia within normal limits. COVID/influenza negative. UA without evidence of infection. BNP >3000 consistent with HF  Independently interpreted imaging ordered as follows. Chest x-ray with worsening bilateral pleural effusions and left lung markings which are  concerning for pulmonary edema versus pneumonia. CT head without evidence of ICH.  Confirmed by radiology who also noted a remote lacunar infarct not seen on previous CT from December 26. CT of the chest notable for large bilateral pleural effusions and multifocal pneumonia.  Question possible PE.  This was confirmed by radiology who did confirm PEs and noted evidence of pulmonary vascular congestion concerning for right heart strain.   Management IV fluids discontinued. Patient given IV Lasix. He was started on heparin infusion for his pulmonary emboli. Also started on broad-spectrum antibiotics for multifocal  pneumonia.  I updated patient's son regarding the status of the patient.  Son conveyed patient's wishes to be DNR/DNI but would continue with medical management.  Call made to medicine to admit for further work-up and management.  I was informed of increased agitation by RN. Patient given 5mg  of Valium.   Prior to admitting the patient I was called into the room due to hypotension.  Patient with agonal breathing.  Bag-valve-mask performed.  Bedside ultrasound showed no cardiac activity.  Patient was pronounced dead at 52. Son at bedside throughout the entire encounter.    Final Clinical Impression(s) / ED Diagnoses Final diagnoses:  Delirium  Acute respiratory failure with hypoxemia (HCC)  Multiple subsegmental pulmonary emboli without acute cor pulmonale (HCC)  Multifocal pneumonia  Acute on chronic diastolic congestive heart failure ([place])  AKI (acute kidney injury) ([place])           This chart was dictated using voice recognition software.  Despite best efforts to proofread,  errors can occur which can change the documentation meaning.        Fatima Blank, MD 03/05/21 1315

## 2021-02-26 NOTE — ED Notes (Signed)
Received verbal report from Catherine W. RN at this time 

## 2021-02-27 ENCOUNTER — Emergency Department (HOSPITAL_COMMUNITY): Payer: Medicare Other

## 2021-02-27 LAB — I-STAT VENOUS BLOOD GAS, ED
Acid-base deficit: 2 mmol/L (ref 0.0–2.0)
Bicarbonate: 24.4 mmol/L (ref 20.0–28.0)
Calcium, Ion: 1.15 mmol/L (ref 1.15–1.40)
HCT: 52 % (ref 39.0–52.0)
Hemoglobin: 17.7 g/dL — ABNORMAL HIGH (ref 13.0–17.0)
O2 Saturation: 56 %
Potassium: 4.8 mmol/L (ref 3.5–5.1)
Sodium: 141 mmol/L (ref 135–145)
TCO2: 26 mmol/L (ref 22–32)
pCO2, Ven: 45.5 mmHg (ref 44.0–60.0)
pH, Ven: 7.336 (ref 7.250–7.430)
pO2, Ven: 32 mmHg (ref 32.0–45.0)

## 2021-02-27 LAB — URINALYSIS, ROUTINE W REFLEX MICROSCOPIC
Bacteria, UA: NONE SEEN
Bilirubin Urine: NEGATIVE
Glucose, UA: NEGATIVE mg/dL
Hgb urine dipstick: NEGATIVE
Ketones, ur: 5 mg/dL — AB
Leukocytes,Ua: NEGATIVE
Nitrite: NEGATIVE
Protein, ur: 30 mg/dL — AB
Specific Gravity, Urine: 1.021 (ref 1.005–1.030)
pH: 5 (ref 5.0–8.0)

## 2021-02-27 LAB — CBC WITH DIFFERENTIAL/PLATELET
Abs Immature Granulocytes: 0.04 10*3/uL (ref 0.00–0.07)
Basophils Absolute: 0 10*3/uL (ref 0.0–0.1)
Basophils Relative: 0 %
Eosinophils Absolute: 0 10*3/uL (ref 0.0–0.5)
Eosinophils Relative: 0 %
HCT: 52.9 % — ABNORMAL HIGH (ref 39.0–52.0)
Hemoglobin: 17.2 g/dL — ABNORMAL HIGH (ref 13.0–17.0)
Immature Granulocytes: 0 %
Lymphocytes Relative: 13 %
Lymphs Abs: 1.3 10*3/uL (ref 0.7–4.0)
MCH: 33.7 pg (ref 26.0–34.0)
MCHC: 32.5 g/dL (ref 30.0–36.0)
MCV: 103.5 fL — ABNORMAL HIGH (ref 80.0–100.0)
Monocytes Absolute: 1.1 10*3/uL — ABNORMAL HIGH (ref 0.1–1.0)
Monocytes Relative: 10 %
Neutro Abs: 7.8 10*3/uL — ABNORMAL HIGH (ref 1.7–7.7)
Neutrophils Relative %: 77 %
Platelets: 293 10*3/uL (ref 150–400)
RBC: 5.11 MIL/uL (ref 4.22–5.81)
RDW: 14.9 % (ref 11.5–15.5)
WBC: 10.2 10*3/uL (ref 4.0–10.5)
nRBC: 0 % (ref 0.0–0.2)

## 2021-02-27 LAB — I-STAT CHEM 8, ED
BUN: 25 mg/dL — ABNORMAL HIGH (ref 8–23)
Calcium, Ion: 1.12 mmol/L — ABNORMAL LOW (ref 1.15–1.40)
Chloride: 108 mmol/L (ref 98–111)
Creatinine, Ser: 1.3 mg/dL — ABNORMAL HIGH (ref 0.61–1.24)
Glucose, Bld: 97 mg/dL (ref 70–99)
HCT: 53 % — ABNORMAL HIGH (ref 39.0–52.0)
Hemoglobin: 18 g/dL — ABNORMAL HIGH (ref 13.0–17.0)
Potassium: 4.9 mmol/L (ref 3.5–5.1)
Sodium: 140 mmol/L (ref 135–145)
TCO2: 25 mmol/L (ref 22–32)

## 2021-02-27 LAB — COMPREHENSIVE METABOLIC PANEL
ALT: 40 U/L (ref 0–44)
AST: 53 U/L — ABNORMAL HIGH (ref 15–41)
Albumin: 3.5 g/dL (ref 3.5–5.0)
Alkaline Phosphatase: 114 U/L (ref 38–126)
Anion gap: 11 (ref 5–15)
BUN: 22 mg/dL (ref 8–23)
CO2: 21 mmol/L — ABNORMAL LOW (ref 22–32)
Calcium: 8.9 mg/dL (ref 8.9–10.3)
Chloride: 104 mmol/L (ref 98–111)
Creatinine, Ser: 1.36 mg/dL — ABNORMAL HIGH (ref 0.61–1.24)
GFR, Estimated: 47 mL/min — ABNORMAL LOW (ref >60)
Glucose, Bld: 103 mg/dL — ABNORMAL HIGH (ref 70–99)
Potassium: 5 mmol/L (ref 3.5–5.1)
Sodium: 136 mmol/L (ref 135–145)
Total Bilirubin: 1.4 mg/dL — ABNORMAL HIGH (ref 0.3–1.2)
Total Protein: 7 g/dL (ref 6.5–8.1)

## 2021-02-27 LAB — LACTIC ACID, PLASMA
Lactic Acid, Venous: 1.8 mmol/L (ref 0.5–1.9)
Lactic Acid, Venous: 1.9 mmol/L (ref 0.5–1.9)

## 2021-02-27 LAB — APTT: aPTT: 31 seconds (ref 24–36)

## 2021-02-27 LAB — BRAIN NATRIURETIC PEPTIDE: B Natriuretic Peptide: 3116.9 pg/mL — ABNORMAL HIGH (ref 0.0–100.0)

## 2021-02-27 LAB — PROTIME-INR
INR: 1.3 — ABNORMAL HIGH (ref 0.8–1.2)
Prothrombin Time: 16.5 seconds — ABNORMAL HIGH (ref 11.4–15.2)

## 2021-02-27 LAB — TROPONIN I (HIGH SENSITIVITY)
Troponin I (High Sensitivity): 48 ng/L — ABNORMAL HIGH (ref <18)
Troponin I (High Sensitivity): 48 ng/L — ABNORMAL HIGH (ref <18)

## 2021-02-27 LAB — RESP PANEL BY RT-PCR (FLU A&B, COVID) ARPGX2
Influenza A by PCR: NEGATIVE
Influenza B by PCR: NEGATIVE
SARS Coronavirus 2 by RT PCR: NEGATIVE

## 2021-02-27 LAB — AMMONIA: Ammonia: 10 umol/L (ref 9–35)

## 2021-02-27 MED ORDER — IOHEXOL 350 MG/ML SOLN
80.0000 mL | Freq: Once | INTRAVENOUS | Status: AC | PRN
  Administered 2021-02-27: 80 mL via INTRAVENOUS

## 2021-02-27 MED ORDER — SODIUM CHLORIDE 0.9 % IV BOLUS (SEPSIS)
1000.0000 mL | Freq: Once | INTRAVENOUS | Status: DC
  Administered 2021-02-27: 1000 mL via INTRAVENOUS

## 2021-02-27 MED ORDER — HEPARIN BOLUS VIA INFUSION
4000.0000 [IU] | Freq: Once | INTRAVENOUS | Status: AC
  Administered 2021-02-27: 4000 [IU] via INTRAVENOUS
  Filled 2021-02-27: qty 4000

## 2021-02-27 MED ORDER — VANCOMYCIN HCL 1250 MG/250ML IV SOLN
1250.0000 mg | Freq: Once | INTRAVENOUS | Status: DC
  Filled 2021-02-27: qty 250

## 2021-02-27 MED ORDER — FUROSEMIDE 10 MG/ML IJ SOLN
60.0000 mg | Freq: Once | INTRAMUSCULAR | Status: AC
Start: 2021-02-27 — End: 2021-02-27
  Administered 2021-02-27: 60 mg via INTRAVENOUS
  Filled 2021-02-27: qty 6

## 2021-02-27 MED ORDER — HYDROMORPHONE HCL 1 MG/ML IJ SOLN
0.5000 mg | Freq: Once | INTRAMUSCULAR | Status: AC
  Administered 2021-02-27: 0.5 mg via INTRAVENOUS
  Filled 2021-02-27: qty 1

## 2021-02-27 MED ORDER — HEPARIN (PORCINE) 25000 UT/250ML-% IV SOLN
1150.0000 [IU]/h | INTRAVENOUS | Status: DC
  Administered 2021-02-27: 1150 [IU]/h via INTRAVENOUS
  Filled 2021-02-27: qty 250

## 2021-02-27 MED ORDER — DIAZEPAM 5 MG/ML IJ SOLN
5.0000 mg | Freq: Once | INTRAMUSCULAR | Status: AC
  Administered 2021-02-27: 5 mg via INTRAVENOUS
  Filled 2021-02-27: qty 2

## 2021-02-27 MED ORDER — LEVOFLOXACIN IN D5W 750 MG/150ML IV SOLN
750.0000 mg | Freq: Once | INTRAVENOUS | Status: AC
  Administered 2021-02-27: 750 mg via INTRAVENOUS
  Filled 2021-02-27: qty 150

## 2021-02-27 MED ORDER — SODIUM CHLORIDE 0.9 % IV SOLN
1000.0000 mL | INTRAVENOUS | Status: DC
  Administered 2021-02-27: 1000 mL via INTRAVENOUS

## 2021-02-27 MED ORDER — VANCOMYCIN HCL 1250 MG/250ML IV SOLN: 1250.0000 mg | INTRAVENOUS | Status: DC

## 2021-03-04 LAB — CULTURE, BLOOD (ROUTINE X 2)
Culture: NO GROWTH
Culture: NO GROWTH
Special Requests: ADEQUATE
Special Requests: ADEQUATE

## 2021-03-25 NOTE — ED Notes (Signed)
Dr. Leonette Monarch at bedside performing [place] of heart at this time. Resp at bedside assisting ventilations with BVM

## 2021-03-25 NOTE — ED Notes (Signed)
Provider at bedside

## 2021-03-25 NOTE — ED Notes (Signed)
Pt transported to radiology.

## 2021-03-25 NOTE — ED Notes (Signed)
Provider requesting [place] at this time. Mel Almond RN went to get same and brought to bedside. Resp continues to be assisted with BVM at this time

## 2021-03-25 NOTE — ED Notes (Signed)
Provider at bedside due to pt current bp. When returned to room with provider pt was having agonal resp. Bvm started at this time. Resp called and at this time is at bedside. Pt is a DNR/ DNI, resp ventilations being assisted at this time

## 2021-03-25 NOTE — ED Notes (Signed)
Transport here to get pt. Pt is having decreased o2 sats with current o2 settings increased to 6lpm. Per family pt is still jumping around and moving around in the bed. Provider made aware

## 2021-03-25 NOTE — ED Notes (Signed)
Son at bedside with pt at this time

## 2021-03-25 NOTE — ED Notes (Signed)
Per resp pt is becoming difficult to ventilate at this time. Provider is performing [place] of heart.

## 2021-03-25 NOTE — ED Notes (Signed)
Son [name] [name] states funeral home to be used is [facility] [phone], [place]. [place] [address]

## 2021-03-25 NOTE — ED Notes (Signed)
Prefit external catheter was placed on the pt.

## 2021-03-25 NOTE — Progress Notes (Signed)
ANTICOAGULATION & ANTIBIOTIC CONSULT NOTE - Initial Consult  Pharmacy Consult for heparin; vancomycin Indication: pulmonary embolus; pneumonia   Allergies  Allergen Reactions   Penicillins Anaphylaxis    Patient Measurements:   Heparin Dosing Weight: 66 kg   Vital Signs: Temp: 97.6 F (36.4 C) (01/05 2223) Temp Source: Rectal (01/05 2223) BP: 146/103 (01/06 0245) Pulse Rate: 58 (01/06 0245)  Labs: Recent Labs    02/28/2021 0030 02-28-21 0034  HGB 17.2* 18.0*   17.7*  HCT 52.9* 53.0*   52.0  PLT 293  --   APTT 31  --   LABPROT 16.5*  --   INR 1.3*  --   CREATININE 1.36* 1.30*  TROPONINIHS 48*  --     CrCl cannot be calculated (Unknown ideal weight.).   Medical History: Past Medical History:  Diagnosis Date   Complete heart block (HCC)    Symptomatic   Coronary artery disease    GERD (gastroesophageal reflux disease)    Hearing loss    Hypertension    Iliac artery aneurysm, right (HCC)    Prostate cancer (HCC)    SVT (supraventricular tachycardia) (HCC)     Medications:  (Not in a hospital admission)   Assessment: AC: 41 YOM who presented with AMS and agitation found to have O2 saturations in the 80s. CT PE revealing an acute pulmonary embolism. Pharmacy consulted to start IV heparin.   H/H elevated, Plt wnl. SCr 1.3.   ID: CT chest also concerning for multifocal pneumonia. Pharmacy consulted to start vancomycin. Patient received a dose of Levaquin in the ED.   Goal of Therapy:  Heparin level 0.3-0.7 units/ml Monitor platelets by anticoagulation protocol: Yes   Plan:  -Heparin 4000 units IV bolus followed by heparin infusion at 1150 units/hr -F/u 8 hr HL -Monitor daily HL, CBC and s/s of bleeding  -Vancomycin 1250 mg IV load followed by Vancomycin 1250 mg IV Q 48 hrs. Goal AUC 400-550. Expected AUC: 453 SCr used: 1.3 -Monitor CBC, renal fx, cultures and clinical progress -Vanc levels as indicated    Albertina Parr, PharmD., BCPS,  BCCCP Clinical Pharmacist Please refer to [facility] for unit-specific pharmacist

## 2021-03-25 NOTE — ED Notes (Signed)
Provider at bedside at this time. Made aware unable to do bladder scan due to unable to locate it in the department. Advised to go ahead with the in and out cath

## 2021-03-25 NOTE — ED Notes (Signed)
Called resp to request a simple mask or a venti mask for pt due to decrease in o2 sats and pt only breathing out of his mouth. Advised to place pt on non-rebreather and they would be here shortly. Pt placed on non-rebreather at this time

## 2021-03-25 NOTE — ED Notes (Signed)
Called pt placement and spoke with [name] [name].

## 2021-03-25 NOTE — ED Notes (Signed)
[address] spoke with [name] [name] advised pt is not a donor. Ref #[id number]

## 2021-03-25 NOTE — ED Notes (Signed)
Provider called time of death at this time. Resp ventilations ceased. Medications stopped. Provider is speaking with family at this time.

## 2021-03-25 DEATH — deceased

## 2021-03-27 ENCOUNTER — Other Ambulatory Visit: Payer: Self-pay | Admitting: Interventional Cardiology

## 2021-08-21 IMAGING — CR DG SHOULDER 2+V*L*
3 series · 3 of 3 positions shown · non-contrast
Comparison: 01/31/2015

CLINICAL DATA: Left shoulder pain for 3-4 weeks, limited range of
motion

EXAM:
LEFT SHOULDER - 2+ VIEW

[w shoulder ap internal left * (1 of 2)]
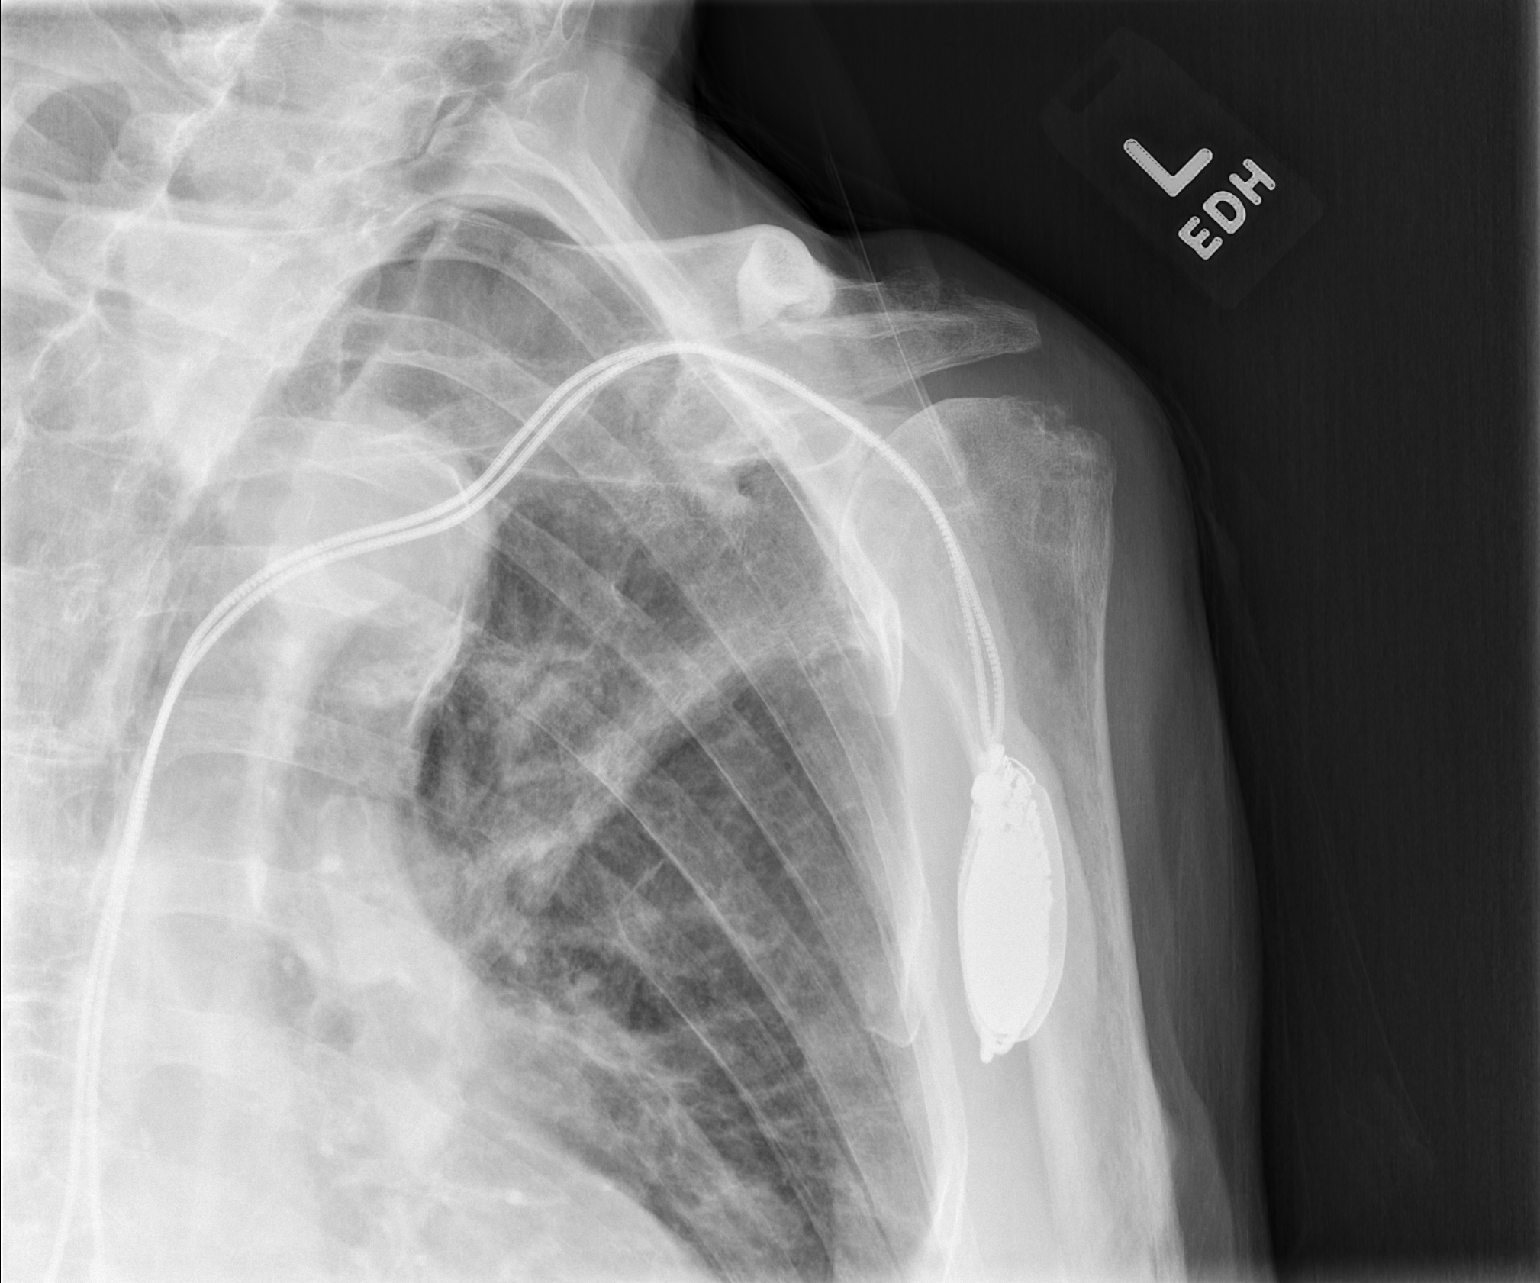

[w shoulder ap internal left * (2 of 2)]
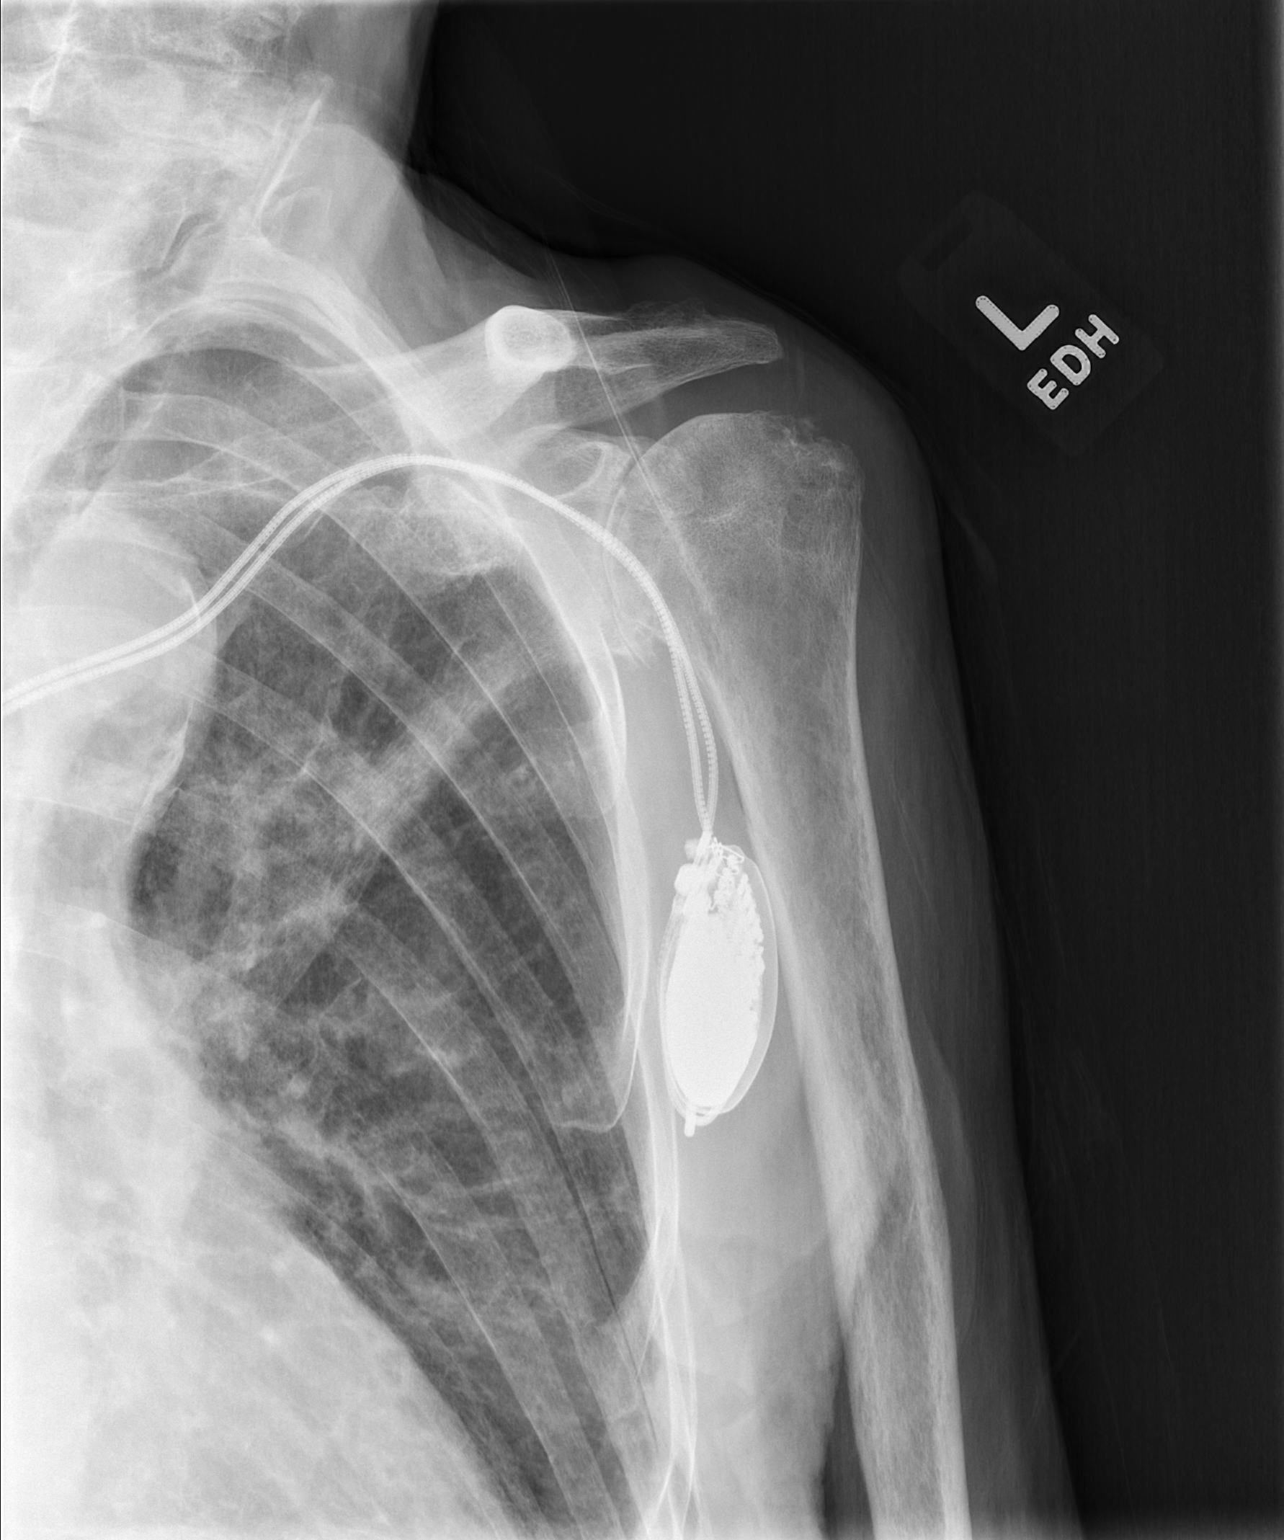

[w shoulder y view left *]
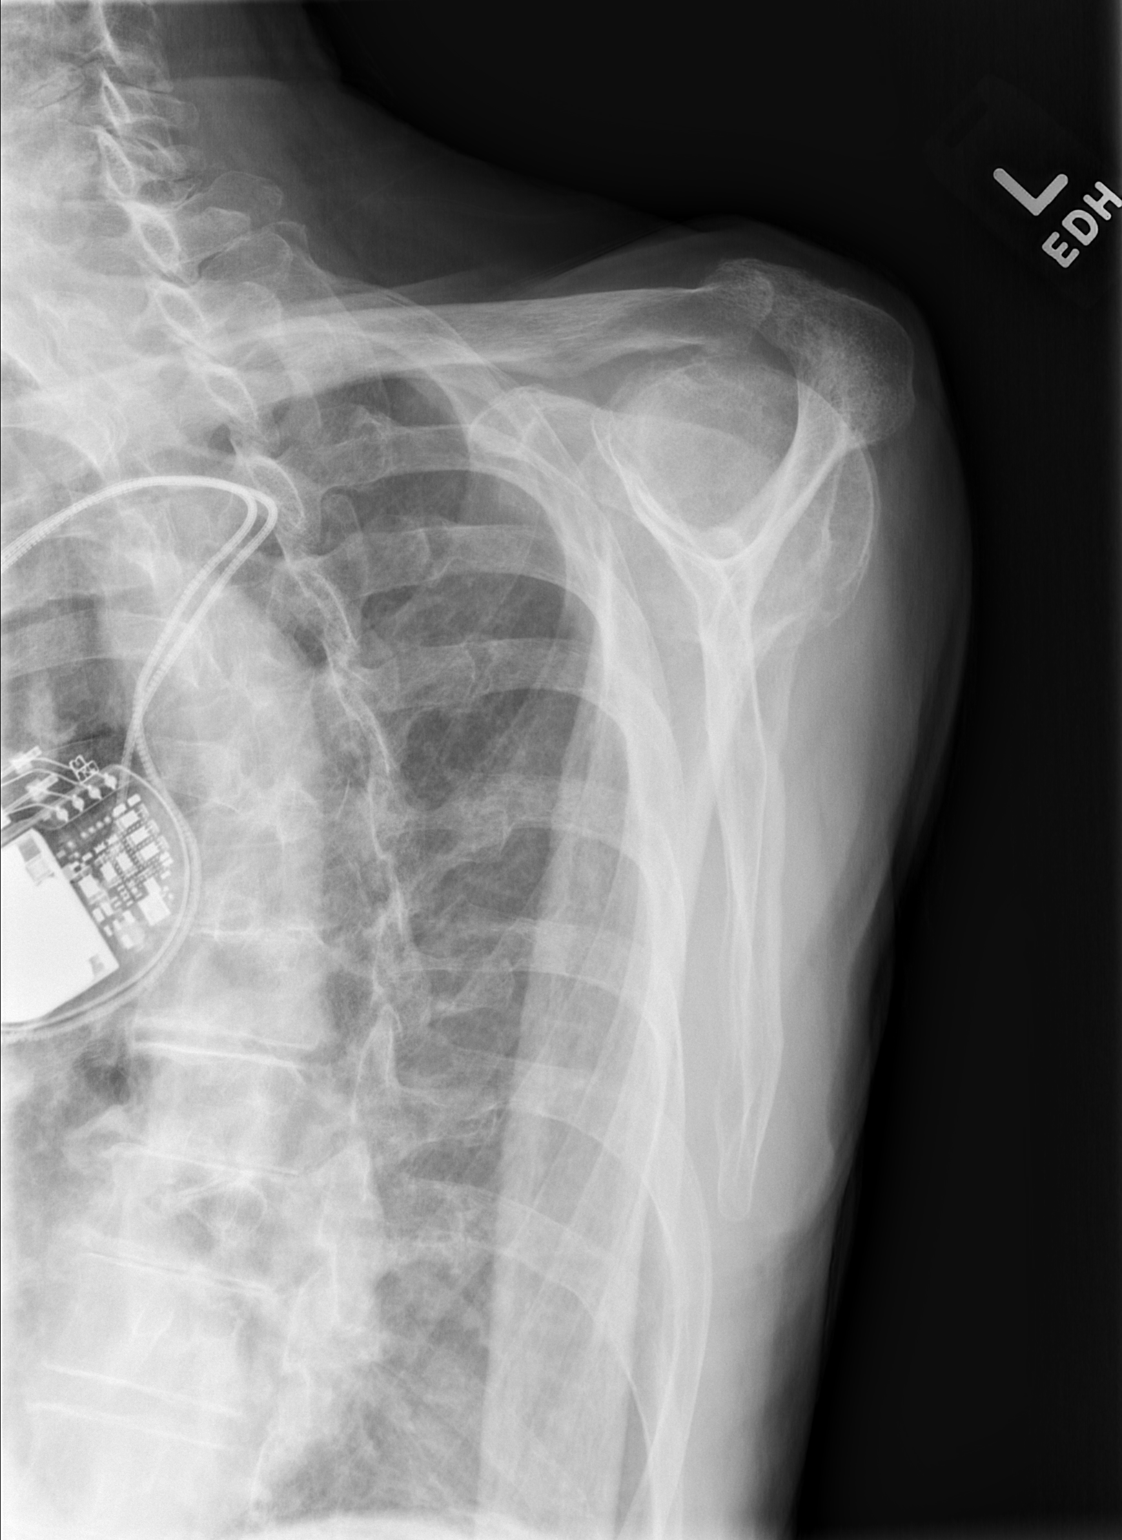

[3 of 3 positions shown; findings below may reference images not displayed]

FINDINGS: Internal rotation, external rotation, transscapular views of the
left shoulder demonstrate severe end-stage glenohumeral
osteoarthritis with bone-on-bone contact, eburnation, and large
marginal osteophytes. Mild hypertrophic changes are seen of the
acromioclavicular joint. No fracture. Pacemaker is noted. Otherwise
the left chest is clear.
IMPRESSION: 1. Severe glenohumeral osteoarthritis.  No acute fracture.

## 2022-09-20 IMAGING — CT CT ABD-PELV W/ CM
2 of 6 series · 14 of 46 positions shown, 16 images · IV contrast (omnipaque)
Comparison: None.

CLINICAL DATA: Pulmonary embolism (PE) suspected, unknown D-dimer
hypoxia; Bowel obstruction suspected

EXAM:
CT ANGIOGRAPHY CHEST
CT ABDOMEN AND PELVIS WITH CONTRAST
TECHNIQUE: Multidetector CT imaging of the chest was performed using the
standard protocol during bolus administration of intravenous
contrast. Multiplanar CT image reconstructions and MIPs were
obtained to evaluate the vascular anatomy. Multidetector CT imaging
of the abdomen and pelvis was performed using the standard protocol
during bolus administration of intravenous contrast.
CONTRAST:  80mL OMNIPAQUE IOHEXOL 350 MG/ML SOLN

[Series 7: abdomen 5.0 · axial · 0.70mm/px · z∈[+638,+988]mm · 11 of 85 slices shown, 13 images]
[im 8/85  soft-tissue]
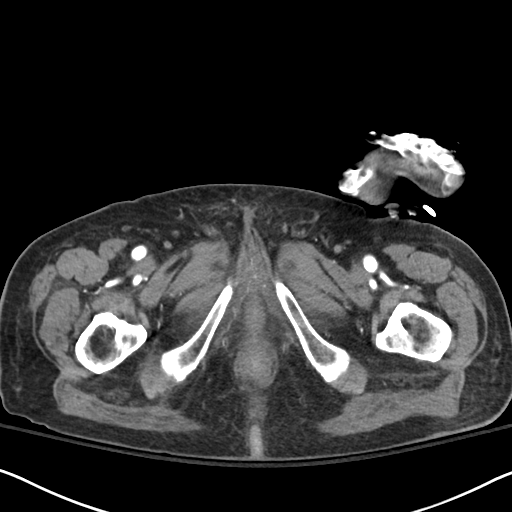
[im 8/85  bone]
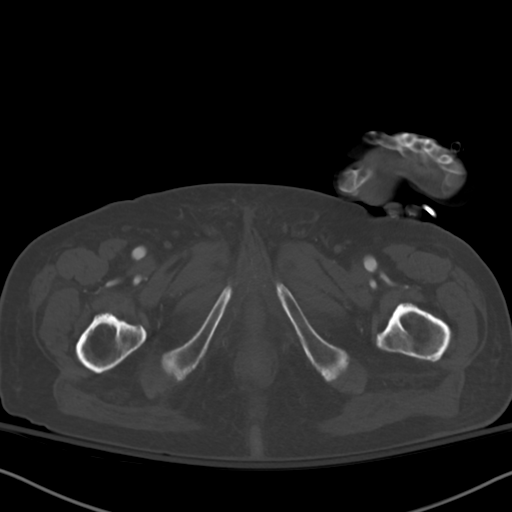
[im 15/85  soft-tissue]
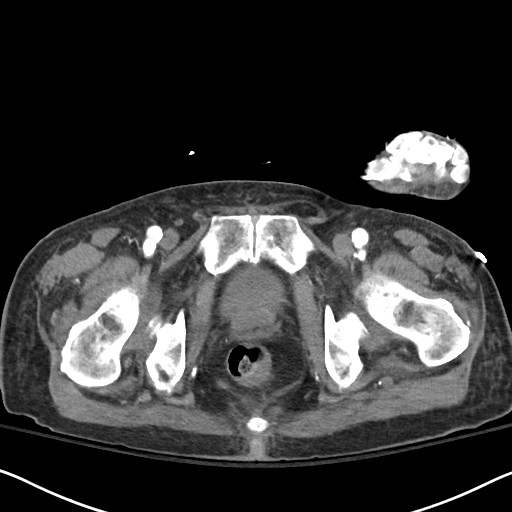
[im 22/85  soft-tissue]
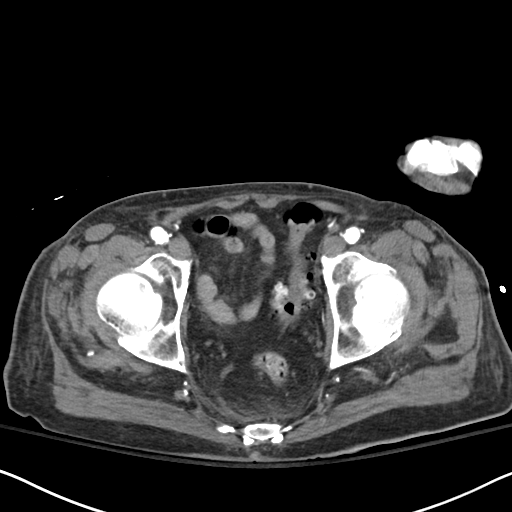
[im 29/85  soft-tissue]
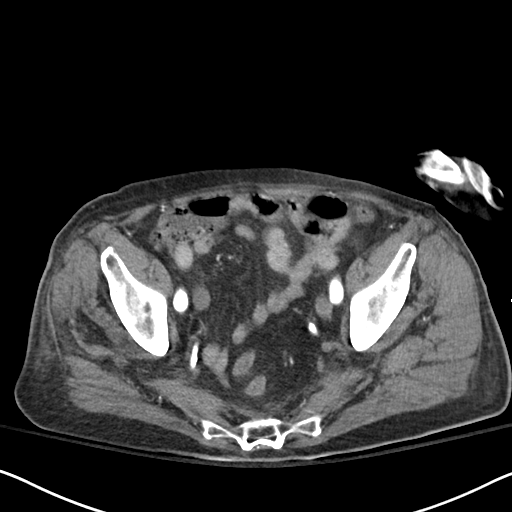
[im 36/85  soft-tissue]
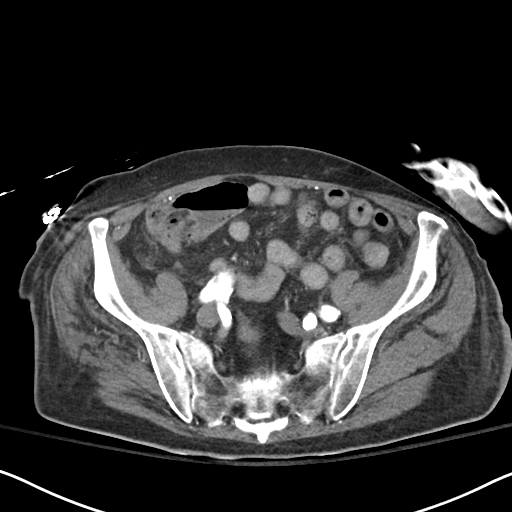
[im 43/85  soft-tissue]
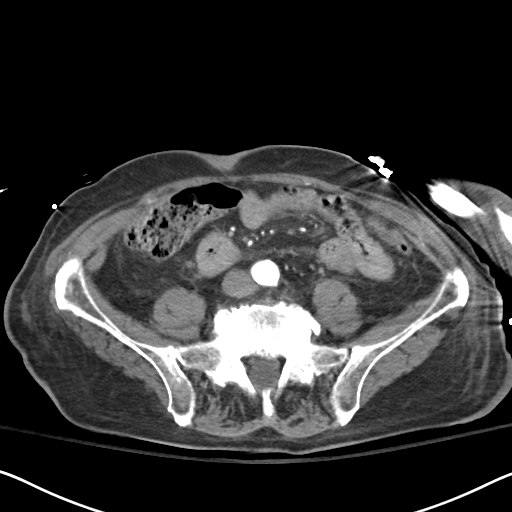
[im 50/85  soft-tissue]
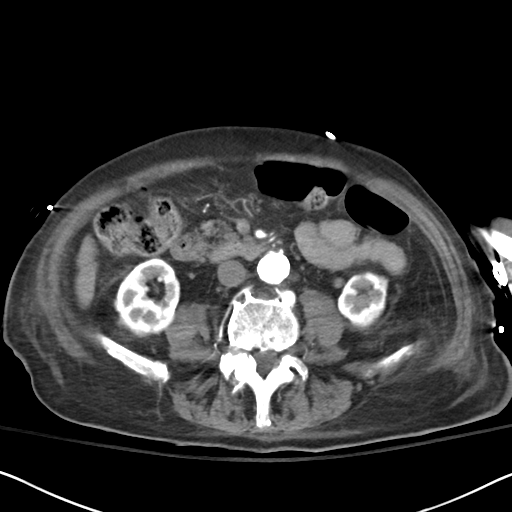
[im 57/85  soft-tissue]
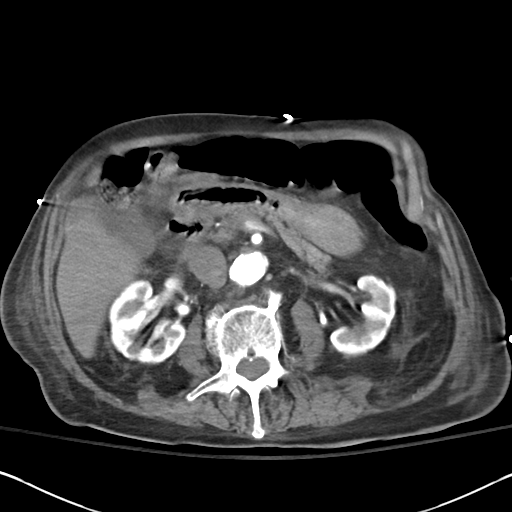
[im 64/85  soft-tissue]
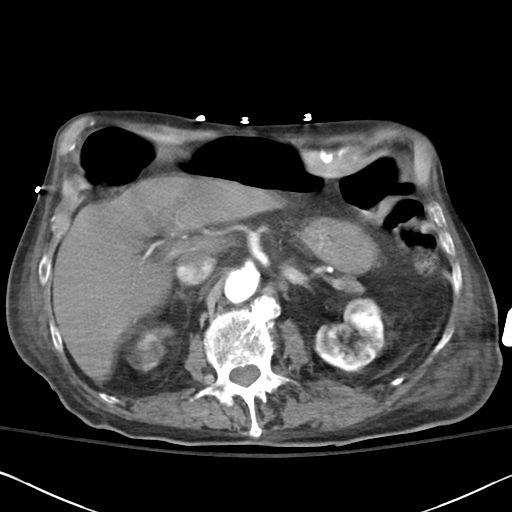
[im 64/85  bone]
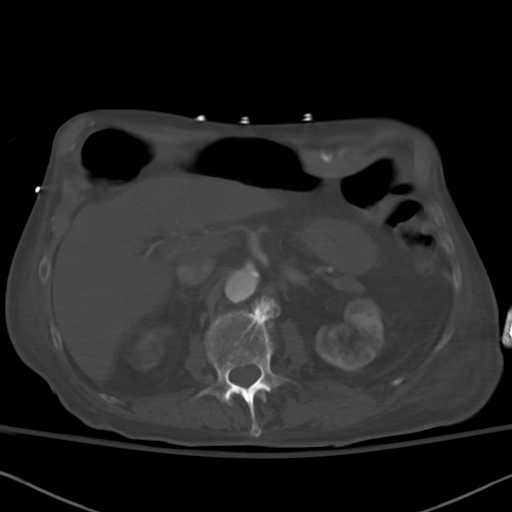
[im 71/85  soft-tissue]
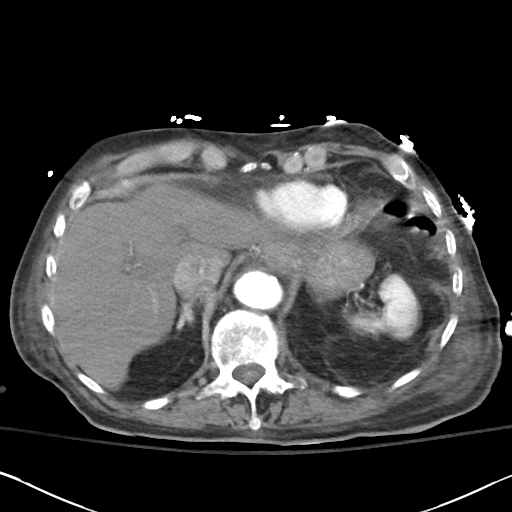
[im 78/85  soft-tissue]
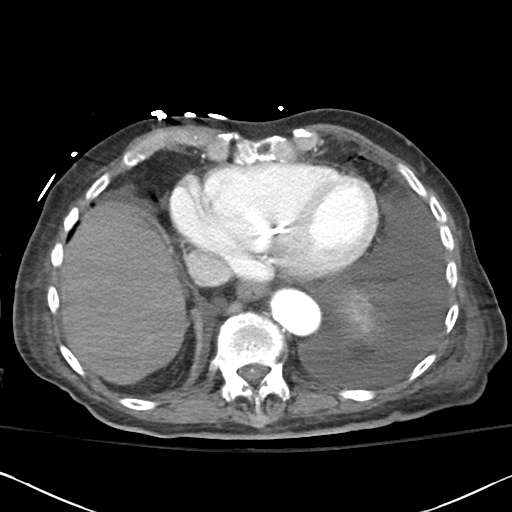

[Series 10: abdomen 3.0 mpr cor · coronal · 0.78mm/px · 3 of 86 slices shown]
[im 29/86  soft-tissue]
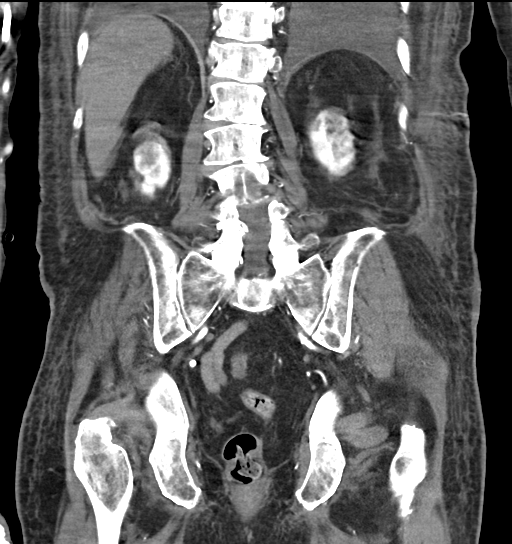
[im 38/86  soft-tissue]
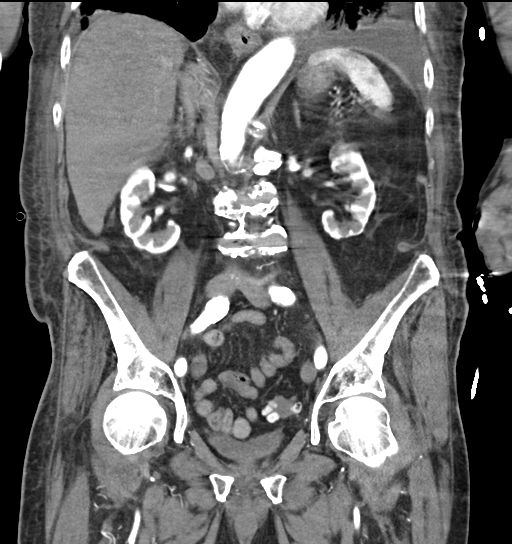
[im 48/86  soft-tissue]
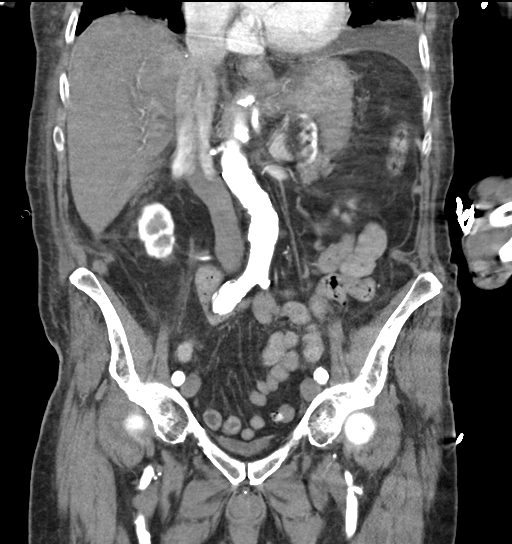

[14 of 46 positions shown; findings below may reference images not displayed]

FINDINGS: CTA CHEST FINDINGS

Cardiovascular: There is adequate opacification of the pulmonary
arterial tree. While there is mixing artifact noted within the
pulmonary artery centrally, there is a intraluminal branching
filling defect identified within the segmental pulmonary arteries of
the anterior and apical segments of the right upper lobe in keeping
with acute pulmonary embolism. Possible small similar filling defect
within the anterior segmental pulmonary artery of the left upper
lobe. The embolic burden is small. The central pulmonary arteries
are enlarged in keeping with changes of pulmonary arterial
hypertension.

Moderate coronary artery calcification. Mild global cardiomegaly.
There is enlargement of the right ventricle and right atrium with
reversal of the normal RV/LV ratio (1.12). While this is technically
compatible with changes of right heart strain, the chronicity of
this finding is not clearly determined on this examination alone.

No pericardial effusion. Moderate atherosclerotic calcification
within the thoracic aorta. The ascending aorta and proximal
descending thoracic aorta are mildly dilated measuring 4.1 cm and
3.3 cm in greatest dimension. The distal descending thoracic aorta
is of normal caliber.

Mediastinum/Nodes: No enlarged mediastinal, hilar, or axillary lymph
nodes. Thyroid gland, trachea, and esophagus demonstrate no
significant findings.

Lungs/Pleura: Moderate right and large left pleural effusions are
present. Superimposed extensive airspace infiltrate is seen
throughout the left lung. Patchy ground-glass infiltrate is noted
within the right lung, most in keeping with changes of multifocal
pneumonia in the acute setting. No pneumothorax.

Musculoskeletal: No acute bone abnormality.

Review of the MIP images confirms the above findings.

CT ABDOMEN and PELVIS FINDINGS

Hepatobiliary: No focal liver abnormality is seen. No gallstones,
gallbladder wall thickening, or biliary dilatation.

Pancreas: Unremarkable

Spleen: Unremarkable

Adrenals/Urinary Tract: The adrenal glands are unremarkable. The
kidneys are atrophic bilaterally but are otherwise unremarkable.
Bladder unremarkable.

Stomach/Bowel: Moderate sigmoid diverticulosis. The stomach, large
bowel, and small bowel are otherwise unremarkable. No free
intraperitoneal gas. No free fluid.

Vascular/Lymphatic: Moderate aortoiliac atherosclerotic
calcification. No aortic aneurysm. No pathologic adenopathy within
the abdomen and pelvis.

Reproductive: Prostate is unremarkable.

Other: No abdominal wall hernia. Mild diffuse subcutaneous body wall
edema.

Musculoskeletal: Osseous structures are age-appropriate. No acute
bone abnormality.

Review of the MIP images confirms the above findings.
IMPRESSION: Acute pulmonary embolism. The embolic burden is small. Enlargement
of the central pulmonary arteries is present in keeping with
morphologic changes of pulmonary arterial hypertension. There is
enlargement of the a right heart with reversal of the normal RV/LV
ratio in keeping with changes of right heart strain. The acuity of
this finding, however, is not clearly determined on this
examination. Correlation with recent echocardiographic studies, if
available, would be helpful in assessing acute change.

Extensive multifocal pulmonary infiltrate, more extensive throughout
the left lung, in keeping with changes of acute multifocal
pneumonia.

Moderate right and large left pleural effusions.

Moderate coronary artery calcification.

Mild global cardiomegaly.

Mild dilation of the thoracic aorta with maximal transaxial
dimension of 4.1 cm in its ascending segment. Recommend annual
imaging followup by CTA or MRA. This recommendation follows 3272
ACCF/AHA/AATS/ACR/ASA/SCA/BOYD/NALA/KAMHO/MARIOH Guidelines for the
Diagnosis and Management of Patients with Thoracic Aortic Disease.
Circulation. 3272; 121: [id number]. Aortic aneurysm NOS ([id number])

No acute intra-abdominal pathology identified.
